# Patient Record
Sex: Female | Born: 1950
Health system: Southern US, Community
[De-identification: ages and names within clinical notes are randomized; demographics above are authoritative.]

## PROBLEM LIST (undated history)

## (undated) DIAGNOSIS — E785 Hyperlipidemia, unspecified: Secondary | ICD-10-CM

## (undated) DIAGNOSIS — C549 Malignant neoplasm of corpus uteri, unspecified: Secondary | ICD-10-CM

## (undated) DIAGNOSIS — T7840XA Allergy, unspecified, initial encounter: Secondary | ICD-10-CM

## (undated) DIAGNOSIS — G4730 Sleep apnea, unspecified: Secondary | ICD-10-CM

## (undated) DIAGNOSIS — J45909 Unspecified asthma, uncomplicated: Secondary | ICD-10-CM

## (undated) DIAGNOSIS — K219 Gastro-esophageal reflux disease without esophagitis: Secondary | ICD-10-CM

## (undated) HISTORY — DX: Gastro-esophageal reflux disease without esophagitis: K21.9

## (undated) HISTORY — DX: Unspecified asthma, uncomplicated: J45.909

## (undated) HISTORY — DX: Allergy, unspecified, initial encounter: T78.40XA

## (undated) HISTORY — DX: Malignant neoplasm of corpus uteri, unspecified: C54.9

## (undated) HISTORY — PX: COLONOSCOPY: SHX174

## (undated) HISTORY — DX: Hyperlipidemia, unspecified: E78.5

## (undated) HISTORY — DX: Sleep apnea, unspecified: G47.30

## (undated) HISTORY — PX: UPPER GASTROINTESTINAL ENDOSCOPY: SHX188

---

## 1998-01-10 DIAGNOSIS — C549 Malignant neoplasm of corpus uteri, unspecified: Secondary | ICD-10-CM

## 1998-01-10 HISTORY — PX: OOPHORECTOMY: SHX86

## 1998-01-10 HISTORY — PX: ABDOMINAL HYSTERECTOMY: SHX81

## 1998-01-10 HISTORY — DX: Malignant neoplasm of corpus uteri, unspecified: C54.9

## 1998-01-19 ENCOUNTER — Ambulatory Visit (HOSPITAL_COMMUNITY): Admission: RE | Admit: 1998-01-19 | Discharge: 1998-01-19 | Payer: Self-pay | Admitting: Gastroenterology

## 1998-02-25 ENCOUNTER — Ambulatory Visit (HOSPITAL_COMMUNITY): Admission: RE | Admit: 1998-02-25 | Discharge: 1998-02-25 | Payer: Self-pay | Admitting: Obstetrics and Gynecology

## 1998-09-10 ENCOUNTER — Other Ambulatory Visit: Admission: RE | Admit: 1998-09-10 | Discharge: 1998-09-10 | Payer: Self-pay | Admitting: Obstetrics and Gynecology

## 1998-09-10 ENCOUNTER — Encounter (INDEPENDENT_AMBULATORY_CARE_PROVIDER_SITE_OTHER): Payer: Self-pay | Admitting: Specialist

## 1998-10-07 ENCOUNTER — Encounter: Payer: Self-pay | Admitting: Obstetrics and Gynecology

## 1998-10-12 ENCOUNTER — Encounter (INDEPENDENT_AMBULATORY_CARE_PROVIDER_SITE_OTHER): Payer: Self-pay

## 1998-10-12 ENCOUNTER — Inpatient Hospital Stay (HOSPITAL_COMMUNITY): Admission: RE | Admit: 1998-10-12 | Discharge: 1998-10-14 | Payer: Self-pay | Admitting: Obstetrics and Gynecology

## 1999-02-22 ENCOUNTER — Other Ambulatory Visit: Admission: RE | Admit: 1999-02-22 | Discharge: 1999-02-22 | Payer: Self-pay | Admitting: Obstetrics and Gynecology

## 1999-08-31 ENCOUNTER — Other Ambulatory Visit: Admission: RE | Admit: 1999-08-31 | Discharge: 1999-08-31 | Payer: Self-pay | Admitting: Obstetrics and Gynecology

## 2000-02-22 ENCOUNTER — Other Ambulatory Visit: Admission: RE | Admit: 2000-02-22 | Discharge: 2000-02-22 | Payer: Self-pay | Admitting: Obstetrics and Gynecology

## 2001-02-22 ENCOUNTER — Other Ambulatory Visit: Admission: RE | Admit: 2001-02-22 | Discharge: 2001-02-22 | Payer: Self-pay | Admitting: Obstetrics and Gynecology

## 2002-01-10 LAB — HM COLONOSCOPY: HM Colonoscopy: NORMAL

## 2002-02-26 ENCOUNTER — Other Ambulatory Visit: Admission: RE | Admit: 2002-02-26 | Discharge: 2002-02-26 | Payer: Self-pay | Admitting: Obstetrics and Gynecology

## 2002-09-23 ENCOUNTER — Ambulatory Visit (HOSPITAL_COMMUNITY): Admission: RE | Admit: 2002-09-23 | Discharge: 2002-09-23 | Payer: Self-pay | Admitting: Gastroenterology

## 2003-03-03 ENCOUNTER — Other Ambulatory Visit: Admission: RE | Admit: 2003-03-03 | Discharge: 2003-03-03 | Payer: Self-pay | Admitting: Obstetrics and Gynecology

## 2003-11-25 ENCOUNTER — Ambulatory Visit: Payer: Self-pay | Admitting: Internal Medicine

## 2004-02-02 ENCOUNTER — Ambulatory Visit: Payer: Self-pay | Admitting: Internal Medicine

## 2004-02-11 ENCOUNTER — Ambulatory Visit: Payer: Self-pay | Admitting: Internal Medicine

## 2004-03-02 ENCOUNTER — Ambulatory Visit: Payer: Self-pay | Admitting: Internal Medicine

## 2004-07-07 ENCOUNTER — Ambulatory Visit: Payer: Self-pay | Admitting: Internal Medicine

## 2004-10-19 ENCOUNTER — Ambulatory Visit (HOSPITAL_BASED_OUTPATIENT_CLINIC_OR_DEPARTMENT_OTHER): Admission: RE | Admit: 2004-10-19 | Discharge: 2004-10-19 | Payer: Self-pay | Admitting: Internal Medicine

## 2004-10-28 ENCOUNTER — Ambulatory Visit: Payer: Self-pay | Admitting: Pulmonary Disease

## 2005-05-16 ENCOUNTER — Emergency Department (HOSPITAL_COMMUNITY): Admission: EM | Admit: 2005-05-16 | Discharge: 2005-05-16 | Payer: Self-pay | Admitting: Emergency Medicine

## 2005-05-17 ENCOUNTER — Ambulatory Visit: Payer: Self-pay | Admitting: Endocrinology

## 2005-05-31 ENCOUNTER — Ambulatory Visit: Payer: Self-pay | Admitting: Internal Medicine

## 2005-06-30 ENCOUNTER — Ambulatory Visit: Payer: Self-pay | Admitting: Internal Medicine

## 2005-07-18 ENCOUNTER — Ambulatory Visit: Payer: Self-pay | Admitting: Internal Medicine

## 2006-09-28 ENCOUNTER — Encounter: Payer: Self-pay | Admitting: *Deleted

## 2006-09-28 DIAGNOSIS — E785 Hyperlipidemia, unspecified: Secondary | ICD-10-CM | POA: Insufficient documentation

## 2006-12-06 ENCOUNTER — Encounter: Payer: Self-pay | Admitting: Internal Medicine

## 2006-12-15 ENCOUNTER — Encounter: Payer: Self-pay | Admitting: Internal Medicine

## 2007-02-23 ENCOUNTER — Ambulatory Visit: Payer: Self-pay | Admitting: Internal Medicine

## 2007-08-02 ENCOUNTER — Emergency Department (HOSPITAL_COMMUNITY): Admission: EM | Admit: 2007-08-02 | Discharge: 2007-08-02 | Payer: Self-pay | Admitting: Emergency Medicine

## 2008-03-18 ENCOUNTER — Telehealth: Payer: Self-pay | Admitting: Internal Medicine

## 2008-04-17 ENCOUNTER — Ambulatory Visit: Payer: Self-pay | Admitting: Internal Medicine

## 2008-04-17 LAB — CONVERTED CEMR LAB
ALT: 11 units/L (ref 0–35)
AST: 13 units/L (ref 0–37)
Albumin: 3.5 g/dL (ref 3.5–5.2)
Alkaline Phosphatase: 64 units/L (ref 39–117)
Bilirubin, Direct: 0.1 mg/dL (ref 0.0–0.3)
Cholesterol: 162 mg/dL (ref 0–200)
HDL: 48.4 mg/dL (ref >39.00)
LDL Cholesterol: 86 mg/dL (ref 0–99)
Total Bilirubin: 0.6 mg/dL (ref 0.3–1.2)
Total CHOL/HDL Ratio: 3
Total Protein: 6.4 g/dL (ref 6.0–8.3)
Triglycerides: 138 mg/dL (ref 0.0–149.0)
VLDL: 27.6 mg/dL (ref 0.0–40.0)

## 2008-04-21 ENCOUNTER — Ambulatory Visit: Payer: Self-pay | Admitting: Internal Medicine

## 2008-04-21 DIAGNOSIS — C549 Malignant neoplasm of corpus uteri, unspecified: Secondary | ICD-10-CM | POA: Insufficient documentation

## 2008-04-21 DIAGNOSIS — J309 Allergic rhinitis, unspecified: Secondary | ICD-10-CM | POA: Insufficient documentation

## 2008-06-09 ENCOUNTER — Encounter: Payer: Self-pay | Admitting: Internal Medicine

## 2010-01-05 ENCOUNTER — Encounter: Payer: Self-pay | Admitting: Internal Medicine

## 2010-04-13 ENCOUNTER — Other Ambulatory Visit: Payer: Self-pay | Admitting: Internal Medicine

## 2010-05-28 NOTE — Op Note (Signed)
   NAMELOUVINA, CLEARY                       ACCOUNT NO.:  [id number]   MEDICAL RECORD NO.:  [id number]                   PATIENT TYPE:  AMB   LOCATION:  ENDO                                 FACILITY:  [facility]   PHYSICIAN:  John C. Madilyn Fireman, M.D.                 DATE OF BIRTH:  03/12/1950   DATE OF PROCEDURE:  09/23/2002  DATE OF DISCHARGE:                                 OPERATIVE REPORT   PROCEDURE PERFORMED:  Colonoscopy.   ENDOSCOPIST:  Barrie Folk, M.D.   INDICATIONS FOR PROCEDURE:  Family history of colon cancer in two first  degree relatives.   DESCRIPTION OF PROCEDURE:  The patient was placed in the left lateral  decubitus position and placed on the pulse monitor with continuous low-flow  oxygen delivered by nasal cannula.  The patient was sedated with 75 mcg of  IV fentanyl and 7 mg of IV Versed.  The Olympus video colonoscope was  inserted into the rectum and advanced to the cecum, confirmed by  transillumination of McBurney's point and visualization of the ileocecal  valve and appendiceal orifice.  The prep was excellent.  The cecum,  ascending, transverse, descending and sigmoid colon appeared normal with no  masses, polyps, diverticula or other mucosal abnormalities.  The rectum  likewise appeared normal and retroflex view of the anus revealed no obvious  internal hemorrhoids.  The scope was then withdrawn and the patient returned  to the recovery room in stable condition.  She tolerated the procedure well.  There were no immediate complications.   IMPRESSION:  Normal colonoscopy.   PLAN:  Repeat study in five years.                                               John C. Madilyn Fireman, M.D.    [facility]/MEDQ  D:  09/23/2002  T:  09/23/2002  Job:  [id number]

## 2010-05-28 NOTE — Procedures (Signed)
Diana Hampton, Diana Hampton NO.:  [id number]   MEDICAL RECORD NO.:  [id number]          PATIENT TYPE:  OUT   LOCATION:  SLEEP CENTER                 FACILITY:  [facility]   PHYSICIAN:  Marcelyn Bruins, M.D. [facility] DATE OF BIRTH:  08/04/1950   DATE OF STUDY:  10/19/2004                              NOCTURNAL POLYSOMNOGRAM   REFERRING PHYSICIAN:  Dr. Illene Regulus.   DATE OF STUDY:  October 19, 2004   INDICATION FOR STUDY:  Hypersomnia with sleep apnea.   EPWORTH SLEEPINESS SCORE:  3   SLEEP ARCHITECTURE:  The patient had a total sleep time of 396 minutes with  adequate slow wave sleep, but decreased REM.  Sleep onset latency was normal  as was REM onset.  Sleep efficiency was 89%.   RESPIRATORY DATA:  The patient was found to have apneas and hypopneas for a  respiratory disturbance index of 21 events per hour.  The events were not  positional and there was moderate snoring noted.   OXYGEN DATA:  The patient had O2 desaturation as low as 87% associated with  her obstructive events.   CARDIAC DATA:  No clinically significant cardiac arrhythmias.   MOVEMENT/PARASOMNIA:  None.   IMPRESSION:  Moderate obstructive sleep apnea with a respiratory disturbance  index of 21 events per hour and oxygen desaturation as low as 87%.  Treatment for this degree of sleep apnea may include weight loss alone as  appropriate, upper airway surgery, oral appliance, or continuous positive  airway pressure.  Clinical correlation is suggested.           ______________________________  Marcelyn Bruins, M.D. [facility]  Diplomate, American Board of Sleep  Medicine     [facility]/MEDQ  D:  10/27/2004 08:54:50  T:  10/27/2004 10:01:34  Job:  [id number]

## 2010-06-06 ENCOUNTER — Other Ambulatory Visit: Payer: Self-pay | Admitting: Internal Medicine

## 2010-08-03 ENCOUNTER — Other Ambulatory Visit: Payer: Self-pay | Admitting: Internal Medicine

## 2010-08-03 NOTE — Telephone Encounter (Signed)
#  30 day supply given-patient has been recvg refills since last OV 04/21/2008. Pharmacy note: Patient Must Have Office Visit Prior To Any Future Refill Authorizations.

## 2010-09-23 ENCOUNTER — Other Ambulatory Visit: Payer: Self-pay | Admitting: Internal Medicine

## 2010-09-28 ENCOUNTER — Other Ambulatory Visit: Payer: Self-pay | Admitting: *Deleted

## 2010-09-28 MED ORDER — ATORVASTATIN CALCIUM 20 MG PO TABS: 20.0000 mg | ORAL_TABLET | Freq: Every day | ORAL | Status: DC

## 2010-10-21 ENCOUNTER — Other Ambulatory Visit: Payer: Self-pay | Admitting: Podiatry

## 2010-10-21 DIAGNOSIS — M79672 Pain in left foot: Secondary | ICD-10-CM

## 2010-10-22 ENCOUNTER — Other Ambulatory Visit: Payer: Self-pay

## 2010-10-25 ENCOUNTER — Ambulatory Visit
Admission: RE | Admit: 2010-10-25 | Discharge: 2010-10-25 | Disposition: A | Discharge status: Home / Self Care | Payer: Self-pay | Source: Ambulatory Visit | Attending: Podiatry | Admitting: Podiatry

## 2010-10-25 DIAGNOSIS — M79672 Pain in left foot: Secondary | ICD-10-CM

## 2010-10-25 MED ORDER — GADOBENATE DIMEGLUMINE 529 MG/ML IV SOLN
14.0000 mL | Freq: Once | INTRAVENOUS | Status: AC | PRN
  Administered 2010-10-25: 14 mL via INTRAVENOUS

## 2010-11-18 ENCOUNTER — Encounter: Payer: Self-pay | Admitting: Internal Medicine

## 2010-11-18 ENCOUNTER — Ambulatory Visit (INDEPENDENT_AMBULATORY_CARE_PROVIDER_SITE_OTHER): Payer: BC Managed Care – PPO | Admitting: Internal Medicine

## 2010-11-18 ENCOUNTER — Other Ambulatory Visit (INDEPENDENT_AMBULATORY_CARE_PROVIDER_SITE_OTHER): Payer: BC Managed Care – PPO

## 2010-11-18 VITALS — BP 120/68 | HR 79 | Temp 96.3°F | Ht 65.0 in | Wt 151.0 lb

## 2010-11-18 DIAGNOSIS — Z0000 Encounter for general adult medical examination without abnormal findings: Secondary | ICD-10-CM

## 2010-11-18 DIAGNOSIS — G4730 Sleep apnea, unspecified: Secondary | ICD-10-CM

## 2010-11-18 DIAGNOSIS — E785 Hyperlipidemia, unspecified: Secondary | ICD-10-CM

## 2010-11-18 DIAGNOSIS — J309 Allergic rhinitis, unspecified: Secondary | ICD-10-CM

## 2010-11-18 LAB — LIPID PANEL
Cholesterol: 177 mg/dL (ref 0–200)
HDL: 62.8 mg/dL (ref >39.00)
LDL Cholesterol: 87 mg/dL (ref 0–99)
Total CHOL/HDL Ratio: 3
Triglycerides: 135 mg/dL (ref 0.0–149.0)
VLDL: 27 mg/dL (ref 0.0–40.0)

## 2010-11-18 LAB — HEPATIC FUNCTION PANEL
ALT: 20 U/L (ref 0–35)
AST: 15 U/L (ref 0–37)
Albumin: 3.9 g/dL (ref 3.5–5.2)
Alkaline Phosphatase: 56 U/L (ref 39–117)
Bilirubin, Direct: 0 mg/dL (ref 0.0–0.3)
Total Bilirubin: 0.5 mg/dL (ref 0.3–1.2)
Total Protein: 7.1 g/dL (ref 6.0–8.3)

## 2010-11-18 LAB — COMPREHENSIVE METABOLIC PANEL
ALT: 20 U/L (ref 0–35)
AST: 15 U/L (ref 0–37)
Albumin: 3.9 g/dL (ref 3.5–5.2)
Alkaline Phosphatase: 56 U/L (ref 39–117)
BUN: 15 mg/dL (ref 6–23)
CO2: 29 mEq/L (ref 19–32)
Calcium: 8.9 mg/dL (ref 8.4–10.5)
Chloride: 104 mEq/L (ref 96–112)
Creatinine, Ser: 0.9 mg/dL (ref 0.4–1.2)
GFR: 70.51 mL/min (ref >60.00)
Glucose, Bld: 90 mg/dL (ref 70–99)
Potassium: 4.1 mEq/L (ref 3.5–5.1)
Sodium: 141 mEq/L (ref 135–145)
Total Bilirubin: 0.5 mg/dL (ref 0.3–1.2)
Total Protein: 7.1 g/dL (ref 6.0–8.3)

## 2010-11-18 MED ORDER — ATORVASTATIN CALCIUM 20 MG PO TABS: 20.0000 mg | ORAL_TABLET | Freq: Every day | ORAL | Status: DC

## 2010-11-18 NOTE — Progress Notes (Signed)
Subjective:    Patient ID: Diana Hampton, female    DOB: 02-Jan-1951, 60 y.o.   MRN: [id number]  HPI Diana Hampton presents for medication refill. She has had her gyn exam 2012 nl  Pelvic/PAP and breast exam (Diana Hampton); she has seen Dr. Beaulah Hampton for allergy who has done a full respiratory exam with spirometry. She has been feeling well and doing well with no illness, no surgery or injury.   She did have a sleep study in '06 with an AHI of 21 with the lowest O2 sat of 87% She reports that her symptoms are worse, more breath holding and increased somnolence.   Past Medical History  Diagnosis Date  . Malignant neoplasm of corpus uteri, except isthmus   . Allergy   . Hyperlipidemia    Past Surgical History  Procedure Date  . Abdominal hysterectomy 2000  . Oophorectomy 2000   Family History  Problem Relation Age of Onset  . Hypertension Mother   . Cancer Father 47    Colon  . Heart disease Brother 16    MI  . Diabetes Other     Grandfather   History   Social History  . Marital Status: Married    Spouse Name: N/A    Number of Children: 2  . Years of Education: 14   Occupational History  . 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 Smoking status: Never Smoker   . Smokeless tobacco: Never Used  . Alcohol Use: No  . Drug Use: No  . Sexually Active: Yes -- Female partner(s)   Other Topics Concern  . 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 1 Son 61, 1 daughter 1977-deceased '78 OD (she has had counseling); 5 grandchildren (3 biological, 2 step).  16 y/o granddaughter lives with her, 35 y/o every weekend. Marriage in good health.        Review of Systems Constitutional:  Negative for fever, chills, activity change and unexpected weight change.  HEENT:  Negative for hearing loss, ear pain, congestion, neck stiffness and postnasal  drip. Negative for sore throat or swallowing problems. Negative for dental complaints.   Eyes: Negative for vision loss or change in visual acuity.  Respiratory: Negative for chest tightness and wheezing. Negative for DOE.   Cardiovascular: Negative for chest pain or palpitations. No decreased exercise tolerance Gastrointestinal: No change in bowel habit. No bloating or gas. No reflux or indigestion Genitourinary: Negative for urgency, frequency, flank pain and difficulty urinating.  Musculoskeletal: Negative for myalgias, back pain, arthralgias and gait problem.  Neurological: Negative for dizziness, tremors, weakness and headaches.  Hematological: Negative for adenopathy.  Psychiatric/Behavioral: Negative for behavioral problems and dysphoric mood.       Objective:   Physical Exam Vitals reviewed: great Blood pressure Gen'l- WNWD white woman in no distress HEENT_EACs/TMs normal, oropharynx w/o lesions, C&S clear, PERRLA Neck- supple, no thyromegaly Chest - w/o deformity Lungs - clear to auscultation and percussion Cor - 2+ radial pulse, DP pulse, RRR Neuro - non-focal. Nl gait  Lab Results  Component Value Date   GLUCOSE 90 11/18/2010   CHOL 177 11/18/2010   TRIG 135.0 11/18/2010   HDL 62.80 11/18/2010   LDLCALC 87 11/18/2010   ALT 20 11/18/2010   ALT 20 11/18/2010   AST 15 11/18/2010   AST 15 11/18/2010   NA  141 11/18/2010   K 4.1 11/18/2010   CL 104 11/18/2010   CREATININE 0.9 11/18/2010   BUN 15 11/18/2010   CO2 29 11/18/2010          Assessment & Plan:

## 2010-11-21 DIAGNOSIS — Z0000 Encounter for general adult medical examination without abnormal findings: Secondary | ICD-10-CM | POA: Insufficient documentation

## 2010-11-21 NOTE — Assessment & Plan Note (Signed)
Lab reveals LDL to be better than goal of 130 or less. Liver functions normal.  Plan- continue present regimen

## 2010-11-21 NOTE — Assessment & Plan Note (Signed)
Interval medical history is unremarkable. Physical exam, sans pelvic and breast, is normal. Lab results are normal. She is current with gyn and breast health. She is current with colorectal cancer screening with last study in '04. Immunizations: none on record and she is due, then, for Tdap, pneumonia and shingles vaccine.  In summary- a nice woman who is medically stable. She will return as needed.

## 2010-11-21 NOTE — Assessment & Plan Note (Signed)
Current with Dr. Beaulah Dinning

## 2010-11-22 ENCOUNTER — Encounter: Payer: Self-pay | Admitting: Internal Medicine

## 2010-12-15 ENCOUNTER — Encounter: Payer: Self-pay | Admitting: Pulmonary Disease

## 2010-12-15 ENCOUNTER — Ambulatory Visit (INDEPENDENT_AMBULATORY_CARE_PROVIDER_SITE_OTHER): Payer: BC Managed Care – PPO | Admitting: Pulmonary Disease

## 2010-12-15 VITALS — BP 112/62 | HR 68 | Temp 97.9°F | Ht 66.0 in | Wt 151.6 lb

## 2010-12-15 DIAGNOSIS — G4733 Obstructive sleep apnea (adult) (pediatric): Secondary | ICD-10-CM

## 2010-12-15 NOTE — Progress Notes (Signed)
  Subjective:    Patient ID: Diana Hampton, female    DOB: [birth year], 60 y.o.   MRN: [id number]  HPI The patient is a 60 year old female who I've been asked to see for management of obstructive sleep apnea.  She was diagnosed with moderate sleep apnea in 2006, where she had an AHI of 21 events per hour.  She did work on weight loss, but continued to have a significant snoring issue.  Over the years her snoring has worsened, and her husband has noticed a gurgling sound and an abnormal breathing pattern during sleep.  The patient has also had snoring as well as choking arousals.  She has frequent awakenings at night, and is not rested in the mornings upon arising.  She does not have major issues while at work, but does note some sleep pressure and tiredness with inactivity.  In the evenings, she is unable to stay awake if she sits down.  She denies any sleepiness issues with driving.  Of note, her weight is up about 16 pounds over the last 2 years.   Review of Systems  Constitutional: Negative for fever and unexpected weight change.  HENT: Positive for congestion, sore throat, rhinorrhea, postnasal drip and sinus pressure. Negative for ear pain, nosebleeds, sneezing, trouble swallowing and dental problem.   Eyes: Negative for redness and itching.  Respiratory: Positive for wheezing. Negative for cough, chest tightness and shortness of breath.   Cardiovascular: Negative for palpitations and leg swelling.  Gastrointestinal: Negative for nausea and vomiting.  Genitourinary: Negative for dysuria.  Musculoskeletal: Negative for joint swelling.  Skin: Negative for rash.  Neurological: Positive for headaches.  Hematological: Does not bruise/bleed easily.  Psychiatric/Behavioral: Negative for dysphoric mood. The patient is not nervous/anxious.        Objective:   Physical Exam Constitutional:  Well developed, no acute distress  HENT:  Nares patent without discharge, but deviated septum to left  with significant narrowing.  Oropharynx without exudate, palate and uvula are elongated.   Eyes:  Perrla, eomi, no scleral icterus  Neck:  No JVD, no TMG  Cardiovascular:  Normal rate, regular rhythm, no rubs or gallops.  No murmurs        Intact distal pulses  Pulmonary :  Normal breath sounds, no stridor or respiratory distress   No rales, rhonchi, or wheezing  Abdominal:  Soft, nondistended, bowel sounds present.  No tenderness noted.   Musculoskeletal:  No lower extremity edema noted.  Lymph Nodes:  No cervical lymphadenopathy noted  Skin:  No cyanosis noted  Neurologic:  Alert, appropriate, moves all 4 extremities without obvious deficit.         Assessment & Plan:

## 2010-12-15 NOTE — Assessment & Plan Note (Signed)
The patient has a history of moderate sleep apnea, but has had worsening symptoms since that time.  She is clearly having sleep disruption at night, and has abnormal daytime sleepiness.  She certainly could work on modest weight loss, but I suspect she is going to need more aggressive treatment in order to adequately treat her sleep disordered breathing.  She does have a small lower jaw with an overbite, and this perhaps is her primary issue.  I discussed with her the various treatments, including surgery, dental appliance, and CPAP.  I really do not think surgery is in her best interest, and she feels that she would not be able to wear a dental appliance because of gagging.  She is willing to give CPAP a try. I will set the patient up on cpap at a moderate pressure level to allow for desensitization, and will troubleshoot the device over the next 4-6weeks if needed.  The pt is to call me if having issues with tolerance.  Will then optimize the pressure once patient is able to wear cpap on a consistent basis.

## 2010-12-15 NOTE — Patient Instructions (Signed)
Will start on cpap.  Please call us if having issues with tolerance Work on modest weight loss followup with me in 5 weeks.

## 2010-12-27 ENCOUNTER — Encounter: Payer: Self-pay | Admitting: Pulmonary Disease

## 2010-12-29 ENCOUNTER — Telehealth: Payer: Self-pay | Admitting: Pulmonary Disease

## 2010-12-29 DIAGNOSIS — G4733 Obstructive sleep apnea (adult) (pediatric): Secondary | ICD-10-CM

## 2010-12-29 NOTE — Telephone Encounter (Signed)
Spoke with patient-agrees to set CPAP to auto. Order placed to [facility] for [facility]

## 2010-12-29 NOTE — Telephone Encounter (Signed)
Spoke with pt. She states that she called [facility] at 4:30 to check on CPAP pressure change and was told by [facility] that we never sent the order to them. Chart indicates that the order was given to [place] today at 2:30. She wants this taken care of asap. Will forward to [facility] to re send the order or troubleshoot what the issue is. Please advise, thanks!

## 2010-12-29 NOTE — Telephone Encounter (Signed)
Let pt know she is on a fairly low pressure currently.  Rather than decrease, why don't we change over to auto setting where the machine self adjusts itself to how much she needs.  This is usually a more comfortable setting for most people.  If she is ok with this, will send order to [facility].

## 2010-12-29 NOTE — Telephone Encounter (Signed)
Per pt, she is having a lot of anxiety about using cpap. She says she would like to have the pressure dropped back down until she can get used to it. She did not sleep well last night due to her anxiety. She assured me that it is not a problem with wearing the mask, it is the air pressure. Pls advise.

## 2010-12-30 ENCOUNTER — Telehealth: Payer: Self-pay | Admitting: Pulmonary Disease

## 2010-12-30 NOTE — Telephone Encounter (Signed)
refaxed orders to [facility] pt notified

## 2010-12-30 NOTE — Telephone Encounter (Signed)
Spoke with Inetta Fermo at [facility] and she states that she did not see order for auto cpap change, so I gave her VO and she states will take care of this and call the pt this afternoon. Pt aware of this and states nothing further needed.

## 2011-01-03 ENCOUNTER — Telehealth: Payer: Self-pay

## 2011-01-03 MED ORDER — ATORVASTATIN CALCIUM 20 MG PO TABS: 20.0000 mg | ORAL_TABLET | Freq: Every day | ORAL | Status: DC

## 2011-01-03 NOTE — Telephone Encounter (Signed)
Medication refill for 90days

## 2011-01-19 ENCOUNTER — Encounter: Payer: Self-pay | Admitting: Internal Medicine

## 2011-01-19 ENCOUNTER — Ambulatory Visit (INDEPENDENT_AMBULATORY_CARE_PROVIDER_SITE_OTHER): Payer: BC Managed Care – PPO | Admitting: Pulmonary Disease

## 2011-01-19 ENCOUNTER — Encounter: Payer: Self-pay | Admitting: Pulmonary Disease

## 2011-01-19 VITALS — BP 112/80 | HR 66 | Temp 98.2°F | Ht 66.0 in | Wt 154.0 lb

## 2011-01-19 DIAGNOSIS — G4733 Obstructive sleep apnea (adult) (pediatric): Secondary | ICD-10-CM

## 2011-01-19 NOTE — Progress Notes (Signed)
  Subjective:    Patient ID: Diana Hampton, female    DOB: [birth year], 61 y.o.   MRN: [id number]  HPI Patient comes in today for followup of her structured sleep apnea.  At the last visit, she was started on CPAP, but had issues with pressure tolerance.  She was changed over to the automatic setting, and has done very well since that time.  Her sleep is much improved, and she has good daytime alertness at this point.  She is having no issues with her mask or pressure currently.   Review of Systems  Constitutional: Negative for fever and unexpected weight change.  HENT: Negative for ear pain, nosebleeds, congestion, sore throat, rhinorrhea, sneezing, trouble swallowing, dental problem, postnasal drip and sinus pressure.   Eyes: Negative for redness and itching.  Respiratory: Negative for cough, chest tightness, shortness of breath and wheezing.   Cardiovascular: Negative for palpitations and leg swelling.  Gastrointestinal: Negative for nausea and vomiting.  Genitourinary: Negative for dysuria.  Musculoskeletal: Negative for joint swelling.  Skin: Negative for rash.  Neurological: Negative for headaches.  Hematological: Does not bruise/bleed easily.  Psychiatric/Behavioral: Negative for dysphoric mood. The patient is not nervous/anxious.        Objective:   Physical Exam Well-developed female in no acute distress No skin breakdown or pressure necrosis from the CPAP mask Lower extremities without edema, no cyanosis noted Alert, oriented, does not appear to be sleepy, lives all 4 extremities.       Assessment & Plan:

## 2011-01-19 NOTE — Patient Instructions (Signed)
Continue with cpap, and call if having tolerance issues.  Work on weight loss followup with me in 6mos.

## 2011-01-19 NOTE — Assessment & Plan Note (Signed)
The patient is doing very well with CPAP on the automatic setting.  She feels that she is sleeping well, and has improved daytime alertness.  I have asked her to continue with the CPAP, and to work on modest weight loss.  I've also asked her to keep up with the mask changes and supplies.  She will followup with me in 6 months or sooner if having issues.

## 2011-05-01 ENCOUNTER — Other Ambulatory Visit: Payer: Self-pay | Admitting: Pulmonary Disease

## 2011-06-25 ENCOUNTER — Other Ambulatory Visit: Payer: Self-pay | Admitting: Pulmonary Disease

## 2011-06-25 DIAGNOSIS — G4733 Obstructive sleep apnea (adult) (pediatric): Secondary | ICD-10-CM

## 2011-07-01 ENCOUNTER — Telehealth: Payer: Self-pay | Admitting: Pulmonary Disease

## 2011-07-01 NOTE — Telephone Encounter (Signed)
lmomtcb for lecretia 

## 2011-07-04 NOTE — Telephone Encounter (Signed)
Called, spoke with Diana Hampton.  Pt was given a new cpap at the end of 2012.  An order was placed on 06/25/11, for:  [facility] Needs new s9 escape/auto with h/h set on auto setting (same as current machine).  Diana Hampton would like to clarify this bc insurance will not cover a new machine this soon.  ? Is something wrong with pt's current machine?  I do not see any recent phone msg regarding this and last OV was 01/19/11 w/ no mention of needing a new machine.  Dr. Shelle Iron, pls advise.  Thank you.

## 2011-07-04 NOTE — Telephone Encounter (Signed)
I have reviewed chart, and I can see no reason that I would have done this unless: 1) I received something in the mail on a form requesting this, or pt communicated with me somehow that her machine was not working. 2) I put order in on wrong pt.  If I remember correctly, I received something from dme saying her machine was due for replacement and asking for new machine. I she just got new machine, obviously doesn't need a new one.

## 2011-07-04 NOTE — Telephone Encounter (Signed)
Called and spoke with patient on mobile number. She stated that her cpap machine is working properly and she is not having any issues with her cpap.

## 2011-07-19 ENCOUNTER — Ambulatory Visit (INDEPENDENT_AMBULATORY_CARE_PROVIDER_SITE_OTHER): Payer: BC Managed Care – PPO | Admitting: Pulmonary Disease

## 2011-07-19 ENCOUNTER — Encounter: Payer: Self-pay | Admitting: Pulmonary Disease

## 2011-07-19 VITALS — BP 112/72 | HR 62 | Temp 97.9°F | Ht 66.0 in | Wt 149.4 lb

## 2011-07-19 DIAGNOSIS — G4733 Obstructive sleep apnea (adult) (pediatric): Secondary | ICD-10-CM

## 2011-07-19 NOTE — Assessment & Plan Note (Addendum)
The patient is doing very well on CPAP, and is having no issues with her mask or pressure.  She feels that she is sleeping well, and has excellent daytime alertness.  She is having success with weight loss, and will continue to do so.

## 2011-07-19 NOTE — Patient Instructions (Addendum)
Continue with cpap, work on weight loss.  You are doing well. followup with me in one year, but call if questions or problems.

## 2011-07-19 NOTE — Progress Notes (Signed)
  Subjective:    Patient ID: Diana Hampton, female    DOB: 1950-11-09, 61 y.o.   MRN: [id number]  HPI The patient comes in today for followup of her known obstructive sleep apnea.  She is wearing CPAP compliantly, and is having no issues with a CPAP pressure or mask fit.  She is on the automatic setting, and is doing well with this.  She has lost weight since the last visit, and is continuing to do so.  She feels that she sleeps well, and denies any alertness issues during the day.   Review of Systems  Constitutional: Negative for fever and unexpected weight change.  HENT: Negative for ear pain, nosebleeds, congestion, sore throat, rhinorrhea, sneezing, trouble swallowing, dental problem, postnasal drip and sinus pressure.   Eyes: Negative for redness and itching.  Respiratory: Negative for cough, chest tightness, shortness of breath and wheezing.   Cardiovascular: Negative for palpitations and leg swelling.  Gastrointestinal: Negative for nausea and vomiting.  Genitourinary: Negative for dysuria.  Musculoskeletal: Negative for joint swelling.  Skin: Negative for rash.  Neurological: Negative for headaches.  Hematological: Does not bruise/bleed easily.  Psychiatric/Behavioral: Negative for dysphoric mood. The patient is not nervous/anxious.   All other systems reviewed and are negative.       Objective:   Physical Exam Well-developed female in no acute distress Nose with purulent discharge noted No skin breakdown or pressure necrosis from the CPAP mask Lower extremities without edema, no cyanosis Lower and oriented, moves all 4 extremities.  Does not appear to be sleepy.       Assessment & Plan:

## 2011-07-20 ENCOUNTER — Ambulatory Visit: Payer: BC Managed Care – PPO | Admitting: Pulmonary Disease

## 2012-02-17 ENCOUNTER — Other Ambulatory Visit: Payer: Self-pay | Admitting: *Deleted

## 2012-02-17 MED ORDER — ATORVASTATIN CALCIUM 20 MG PO TABS: 20.0000 mg | ORAL_TABLET | Freq: Every day | ORAL | Status: DC

## 2012-07-18 ENCOUNTER — Ambulatory Visit (INDEPENDENT_AMBULATORY_CARE_PROVIDER_SITE_OTHER): Payer: BC Managed Care – PPO | Admitting: Pulmonary Disease

## 2012-07-18 ENCOUNTER — Encounter: Payer: Self-pay | Admitting: Pulmonary Disease

## 2012-07-18 VITALS — BP 112/70 | HR 64 | Temp 97.7°F | Ht 66.5 in | Wt 151.6 lb

## 2012-07-18 DIAGNOSIS — G4733 Obstructive sleep apnea (adult) (pediatric): Secondary | ICD-10-CM

## 2012-07-18 NOTE — Progress Notes (Signed)
  Subjective:    Patient ID: Diana Hampton, female    DOB: 27-Dec-1950, 63 y.o.   MRN: [id number]  HPI Patient comes in today for followup of her obstructive sleep apnea.  She is wearing CPAP compliantly, is having no issues with her mask fit or pressure.  She feels that she sleeps well, and denies any issues with daytime alertness.  She is trying to work on weight reduction.   Review of Systems  Constitutional: Negative for fever and unexpected weight change.  HENT: Positive for congestion. Negative for ear pain, nosebleeds, sore throat, rhinorrhea, sneezing, trouble swallowing, dental problem, postnasal drip and sinus pressure.   Eyes: Negative for redness and itching.  Respiratory: Negative for cough, chest tightness, shortness of breath and wheezing.   Cardiovascular: Negative for palpitations and leg swelling.  Gastrointestinal: Negative for nausea and vomiting.  Genitourinary: Negative for dysuria.  Musculoskeletal: Negative for joint swelling.  Skin: Negative for rash.  Neurological: Negative for headaches.  Hematological: Does not bruise/bleed easily.  Psychiatric/Behavioral: Negative for dysphoric mood. The patient is not nervous/anxious.        Objective:   Physical Exam Well-developed female in no acute distress Nose with purulent discharge noted No skin breakdown or pressure necrosis from the CPAP mask Neck without lymphadenopathy or thyromegaly Lower extremities without edema, no cyanosis Alert, does not appear to be sleepy, moves all 4 extremities.       Assessment & Plan:

## 2012-07-18 NOTE — Patient Instructions (Addendum)
Continue with cpap, and keep up with mask changes and supplies. Continue working on weight reduction. followup with me in one year if doing well.

## 2012-07-18 NOTE — Assessment & Plan Note (Signed)
The patient continues to do well on CPAP, and I have asked her to keep up with her mask changes and supplies.  I have also encouraged her to work on modest weight loss.  She is to followup with me in one year if she continues to do well.

## 2012-07-20 ENCOUNTER — Telehealth: Payer: Self-pay | Admitting: Pulmonary Disease

## 2012-07-20 DIAGNOSIS — G4733 Obstructive sleep apnea (adult) (pediatric): Secondary | ICD-10-CM

## 2012-07-20 NOTE — Telephone Encounter (Signed)
lmtcb x1 for pt. 

## 2012-07-20 NOTE — Telephone Encounter (Signed)
I told her that she needed to go to her DME and let them try her on the airfit N10 before ordering it.  We don't know if it will fit her properly or not.  Everyone's face is shaped differently.

## 2012-07-20 NOTE — Telephone Encounter (Signed)
Called and spoke with pt and she stated that she would like to try a new mask.  She stated that [facility] showed her a new mask at her last ov and she thinks this was an N10??  Pt could not remember but wanted me to send this message to [facility] to make sure before we sent the order to [facility] for a new mask.  Pt stated that her current mask leaves marks on her face and the pieces are falling out at night.  KC please advise. Thanks  No Known Allergies

## 2012-07-23 NOTE — Telephone Encounter (Signed)
Pt aware. She stated she called [facility] and was advised they needed an order for this. I advised pt will place order for her. Nothing further was needed

## 2012-08-01 ENCOUNTER — Other Ambulatory Visit (INDEPENDENT_AMBULATORY_CARE_PROVIDER_SITE_OTHER): Payer: BC Managed Care – PPO

## 2012-08-01 ENCOUNTER — Ambulatory Visit (INDEPENDENT_AMBULATORY_CARE_PROVIDER_SITE_OTHER): Payer: BC Managed Care – PPO | Admitting: Internal Medicine

## 2012-08-01 ENCOUNTER — Encounter: Payer: Self-pay | Admitting: Internal Medicine

## 2012-08-01 VITALS — BP 136/78 | HR 59 | Temp 97.0°F | Ht 66.0 in | Wt 150.0 lb

## 2012-08-01 DIAGNOSIS — E785 Hyperlipidemia, unspecified: Secondary | ICD-10-CM

## 2012-08-01 DIAGNOSIS — H35019 Changes in retinal vascular appearance, unspecified eye: Secondary | ICD-10-CM

## 2012-08-01 DIAGNOSIS — Z1211 Encounter for screening for malignant neoplasm of colon: Secondary | ICD-10-CM

## 2012-08-01 DIAGNOSIS — H35013 Changes in retinal vascular appearance, bilateral: Secondary | ICD-10-CM

## 2012-08-01 LAB — HEMOGLOBIN A1C: Hgb A1c MFr Bld: 6.3 % (ref 4.6–6.5)

## 2012-08-01 LAB — TSH: TSH: 2.53 u[IU]/mL (ref 0.35–5.50)

## 2012-08-01 LAB — COMPREHENSIVE METABOLIC PANEL
ALT: 18 U/L (ref 0–35)
AST: 18 U/L (ref 0–37)
Albumin: 4 g/dL (ref 3.5–5.2)
Alkaline Phosphatase: 58 U/L (ref 39–117)
BUN: 14 mg/dL (ref 6–23)
CO2: 28 mEq/L (ref 19–32)
Calcium: 9.8 mg/dL (ref 8.4–10.5)
Chloride: 104 mEq/L (ref 96–112)
Creatinine, Ser: 0.8 mg/dL (ref 0.4–1.2)
GFR: 75.07 mL/min (ref >60.00)
Glucose, Bld: 91 mg/dL (ref 70–99)
Potassium: 4.7 mEq/L (ref 3.5–5.1)
Sodium: 139 mEq/L (ref 135–145)
Total Bilirubin: 0.6 mg/dL (ref 0.3–1.2)
Total Protein: 7 g/dL (ref 6.0–8.3)

## 2012-08-01 LAB — LIPID PANEL
Cholesterol: 209 mg/dL — ABNORMAL HIGH (ref 0–200)
HDL: 62.3 mg/dL (ref >39.00)
Total CHOL/HDL Ratio: 3
Triglycerides: 142 mg/dL (ref 0.0–149.0)
VLDL: 28.4 mg/dL (ref 0.0–40.0)

## 2012-08-01 LAB — HEPATIC FUNCTION PANEL
ALT: 18 U/L (ref 0–35)
AST: 18 U/L (ref 0–37)
Albumin: 4 g/dL (ref 3.5–5.2)
Alkaline Phosphatase: 58 U/L (ref 39–117)
Bilirubin, Direct: 0.1 mg/dL (ref 0.0–0.3)
Total Bilirubin: 0.6 mg/dL (ref 0.3–1.2)
Total Protein: 7 g/dL (ref 6.0–8.3)

## 2012-08-01 LAB — LDL CHOLESTEROL, DIRECT: Direct LDL: 124.5 mg/dL

## 2012-08-01 NOTE — Patient Instructions (Addendum)
1. Evaluation for possible diabetes due to retinal abnormalities seen at eye exam:  Chart reviewed - last lab in 2012 with a normal blood sugar Lab today: serum glucose and A1C- a long term look at blood sugar that is the gold standard for making the diagnosis of diabetes  2. Cholesterol management - overdue for follow up lab  Plan Lab today with result via MyChart  3. Health maintenance - due for a general exam at first available.

## 2012-08-01 NOTE — Progress Notes (Signed)
Subjective:    Patient ID: Diana Hampton, female    DOB: 04-09-1950, 62 y.o.   MRN: [id number]  HPI Diana Hampton presents for check of blood sugar. Her OD report indicated dot-blot hemorrhages retina and raised the issue of possible diabetes. Last lab in '12 with Glucose = 90. Has had increase thirst, increased urination, increased hunger.   She is folllowed for hyperlipidemia - last lab in '12.  She is due for gyn exam.   Past Medical History  Diagnosis Date  . Malignant neoplasm of corpus uteri, except isthmus   . Allergy   . Hyperlipidemia    Past Surgical History  Procedure Laterality Date  . Abdominal hysterectomy  2000  . Oophorectomy  2000   Family History  Problem Relation Age of Onset  . Hypertension Mother   . Cancer Father 22    Colon  . Heart disease Brother 24    MI  . Diabetes Other     Grandfather   History   Social History  . Marital Status: Married    Spouse Name: N/A    Number of Children: 2  . Years of Education: 14   Occupational History  . 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 Smoking status: Never Smoker   . Smokeless tobacco: Never Used  . Alcohol Use: No  . Drug Use: No  . Sexually Active: Yes -- Female partner(s)   Other Topics Concern  . 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 1 Son 59, 1 daughter 1977-deceased '36 OD (she has had counseling); 5 grandchildren (3 biological, 2 step).  60 y/o granddaughter lives with her, 34 y/o every weekend. Marriage in good health.      Current Outpatient Prescriptions on File Prior to Visit  Medication Sig Dispense Refill  . albuterol (PROVENTIL HFA;VENTOLIN HFA) 108 (90 BASE) MCG/ACT inhaler Inhale 2 puffs into the lungs every 6 (six) hours as needed for wheezing.      Marland Kitchen atorvastatin (LIPITOR) 20 MG tablet Take 1 tablet (20 mg total) by mouth daily.   90 tablet  0  . montelukast (SINGULAIR) 10 MG tablet Take 10 mg by mouth at bedtime.         No current facility-administered medications on file prior to visit.     Review of Systems System review is negative for any constitutional, cardiac, pulmonary, GI or neuro symptoms or complaints other than as described in the HPI.     Objective:   Physical Exam Filed Vitals:   08/01/12 0933  BP: 136/78  Pulse: 59  Temp: 97 F (36.1 C)   Gen'l WNWD white woman in no distress but anxious Cor- RRR Pulm - normal respirations Neuro - awake, alert, oriented. Normal gait  Recent Results (from the past 2160 hour(s))  HEMOGLOBIN A1C     Status: None   Collection Time    08/01/12 10:43 AM      Result Value Range   Hemoglobin A1C 6.3  4.6 - 6.5 %   Comment: Glycemic Control Guidelines for People with Diabetes:Non Diabetic:  <6%Goal of Therapy: <7%Additional Action Suggested:  >8%   COMPREHENSIVE METABOLIC PANEL     Status: None   Collection Time    08/01/12 10:43 AM      Result Value Range   Sodium 139  135 - 145 mEq/L  Potassium 4.7  3.5 - 5.1 mEq/L   Chloride 104  96 - 112 mEq/L   CO2 28  19 - 32 mEq/L   Glucose, Bld 91  70 - 99 mg/dL   BUN 14  6 - 23 mg/dL   Creatinine, Ser 0.8  0.4 - 1.2 mg/dL   Total Bilirubin 0.6  0.3 - 1.2 mg/dL   Alkaline Phosphatase 58  39 - 117 U/L   AST 18  0 - 37 U/L   ALT 18  0 - 35 U/L   Total Protein 7.0  6.0 - 8.3 g/dL   Albumin 4.0  3.5 - 5.2 g/dL   Calcium 9.8  8.4 - 54.0 mg/dL   GFR 98.11  >91.47 mL/min  LIPID PANEL     Status: Abnormal   Collection Time    08/01/12 10:43 AM      Result Value Range   Cholesterol 209 (*) 0 - 200 mg/dL   Comment: ATP III Classification       Desirable:  < 200 mg/dL               Borderline High:  200 - 239 mg/dL          High:  > = 829 mg/dL   Triglycerides 562.1  0.0 - 149.0 mg/dL   Comment: Normal:  <308 mg/dLBorderline High:  150 - 199 mg/dL   HDL 65.78  >46.96 mg/dL   VLDL 29.5  0.0 - 28.4 mg/dL    Total CHOL/HDL Ratio 3     Comment:                Men          Women1/2 Average Risk     3.4          3.3Average Risk          5.0          4.42X Average Risk          9.6          7.13X Average Risk          15.0          11.0                      HEPATIC FUNCTION PANEL     Status: None   Collection Time    08/01/12 10:43 AM      Result Value Range   Total Bilirubin 0.6  0.3 - 1.2 mg/dL   Bilirubin, Direct 0.1  0.0 - 0.3 mg/dL   Alkaline Phosphatase 58  39 - 117 U/L   AST 18  0 - 37 U/L   ALT 18  0 - 35 U/L   Total Protein 7.0  6.0 - 8.3 g/dL   Albumin 4.0  3.5 - 5.2 g/dL  TSH     Status: None   Collection Time    08/01/12 10:43 AM      Result Value Range   TSH 2.53  0.35 - 5.50 uIU/mL  LDL CHOLESTEROL, DIRECT     Status: None   Collection Time    08/01/12 10:43 AM      Result Value Range   Direct LDL 124.5     Comment: Optimal:  <100 mg/dLNear or Above Optimal:  100-129 mg/dLBorderline High:  130-159 mg/dLHigh:  160-189 mg/dLVery High:  >190 mg/dL          Assessment & Plan:  1. Abnormal eye exam -  no evidence of diabetes

## 2012-08-02 ENCOUNTER — Telehealth: Payer: Self-pay

## 2012-08-02 NOTE — Telephone Encounter (Signed)
Patient notified of results.

## 2012-08-02 NOTE — Telephone Encounter (Signed)
Message copied by Noreene Larsson on Thu Aug 02, 2012  8:21 AM ------      Message from: Illene Regulus E      Created: Wed Aug 01, 2012 10:58 PM       Blood sugar is normal! ------

## 2012-08-04 ENCOUNTER — Encounter: Payer: Self-pay | Admitting: Internal Medicine

## 2012-09-26 ENCOUNTER — Other Ambulatory Visit: Payer: Self-pay

## 2012-09-26 MED ORDER — ATORVASTATIN CALCIUM 20 MG PO TABS: 20.0000 mg | ORAL_TABLET | Freq: Every day | ORAL | Status: DC

## 2012-10-04 ENCOUNTER — Encounter: Payer: BC Managed Care – PPO | Admitting: Internal Medicine

## 2012-12-18 ENCOUNTER — Encounter: Payer: Self-pay | Admitting: Internal Medicine

## 2012-12-18 ENCOUNTER — Other Ambulatory Visit (INDEPENDENT_AMBULATORY_CARE_PROVIDER_SITE_OTHER): Payer: BC Managed Care – PPO

## 2012-12-18 ENCOUNTER — Ambulatory Visit (INDEPENDENT_AMBULATORY_CARE_PROVIDER_SITE_OTHER): Payer: BC Managed Care – PPO | Admitting: Internal Medicine

## 2012-12-18 VITALS — BP 130/70 | HR 61 | Temp 97.1°F | Ht 66.0 in | Wt 147.0 lb

## 2012-12-18 DIAGNOSIS — C549 Malignant neoplasm of corpus uteri, unspecified: Secondary | ICD-10-CM

## 2012-12-18 DIAGNOSIS — Z23 Encounter for immunization: Secondary | ICD-10-CM

## 2012-12-18 DIAGNOSIS — E785 Hyperlipidemia, unspecified: Secondary | ICD-10-CM

## 2012-12-18 DIAGNOSIS — Z0000 Encounter for general adult medical examination without abnormal findings: Secondary | ICD-10-CM

## 2012-12-18 DIAGNOSIS — R739 Hyperglycemia, unspecified: Secondary | ICD-10-CM

## 2012-12-18 DIAGNOSIS — R7309 Other abnormal glucose: Secondary | ICD-10-CM

## 2012-12-18 DIAGNOSIS — G4733 Obstructive sleep apnea (adult) (pediatric): Secondary | ICD-10-CM

## 2012-12-18 DIAGNOSIS — J309 Allergic rhinitis, unspecified: Secondary | ICD-10-CM

## 2012-12-18 LAB — COMPREHENSIVE METABOLIC PANEL
ALT: 22 U/L (ref 0–35)
AST: 18 U/L (ref 0–37)
Albumin: 4.3 g/dL (ref 3.5–5.2)
Alkaline Phosphatase: 59 U/L (ref 39–117)
BUN: 15 mg/dL (ref 6–23)
CO2: 28 mEq/L (ref 19–32)
Calcium: 9.3 mg/dL (ref 8.4–10.5)
Chloride: 100 mEq/L (ref 96–112)
Creatinine, Ser: 0.8 mg/dL (ref 0.4–1.2)
GFR: 78.27 mL/min (ref >60.00)
Glucose, Bld: 95 mg/dL (ref 70–99)
Potassium: 4.3 mEq/L (ref 3.5–5.1)
Sodium: 134 mEq/L — ABNORMAL LOW (ref 135–145)
Total Bilirubin: 0.6 mg/dL (ref 0.3–1.2)
Total Protein: 7.7 g/dL (ref 6.0–8.3)

## 2012-12-18 LAB — HEPATIC FUNCTION PANEL
ALT: 22 U/L (ref 0–35)
AST: 18 U/L (ref 0–37)
Albumin: 4.3 g/dL (ref 3.5–5.2)
Alkaline Phosphatase: 59 U/L (ref 39–117)
Bilirubin, Direct: 0.1 mg/dL (ref 0.0–0.3)
Total Bilirubin: 0.6 mg/dL (ref 0.3–1.2)
Total Protein: 7.7 g/dL (ref 6.0–8.3)

## 2012-12-18 LAB — HEMOGLOBIN A1C: Hgb A1c MFr Bld: 6.3 % (ref 4.6–6.5)

## 2012-12-18 LAB — LIPID PANEL
Cholesterol: 213 mg/dL — ABNORMAL HIGH (ref 0–200)
HDL: 65.6 mg/dL (ref >39.00)
Total CHOL/HDL Ratio: 3
Triglycerides: 93 mg/dL (ref 0.0–149.0)
VLDL: 18.6 mg/dL (ref 0.0–40.0)

## 2012-12-18 LAB — LDL CHOLESTEROL, DIRECT: Direct LDL: 130.7 mg/dL

## 2012-12-18 MED ORDER — MONTELUKAST SODIUM 10 MG PO TABS: 10.0000 mg | ORAL_TABLET | Freq: Every day | ORAL | Status: DC

## 2012-12-18 MED ORDER — ATORVASTATIN CALCIUM 20 MG PO TABS: 20.0000 mg | ORAL_TABLET | Freq: Every day | ORAL | Status: DC

## 2012-12-18 NOTE — Progress Notes (Signed)
Pre visit review using our clinic review tool, if applicable. No additional management support is needed unless otherwise documented below in the visit note. 

## 2012-12-18 NOTE — Progress Notes (Signed)
Subjective:     Patient ID: Diana Hampton, female   DOB: December 26, 1950, 62 y.o.   MRN: [id number]  HPI Ms. Camarena is a 62 yo female with PMH of OSA, asthma and hypercholesteremia who presents for an annual physical.    She is using her CPAP regularly at night. She is breathing well and using her inhalers as prescribed. Montelukast has reduced the frequency she has to use her inhaler. She is taking her other medications as prescribed.    She is losing weight. Her husband and her are eating well and exercising regularly. She is enrolled in a program at work encourages her to exercise and eat well. She tries to prioritize eating vegetables and fruits. She is trying to stop drinking soft drinks (currently drinks one a week). She is walking stairs and exercising with her husband.  She saw her eye doctor and dentist earlier this year.   Social: She enjoys her job and plans to retire when she is 85. No EtOH, smoking, or any other drug use.  Past Medical History  Diagnosis Date  . Malignant neoplasm of corpus uteri, except isthmus   . Allergy   . Hyperlipidemia    Past Surgical History  Procedure Laterality Date  . Abdominal hysterectomy  2000  . Oophorectomy  2000   Family History  Problem Relation Age of Onset  . Hypertension Mother   . Cancer Father 1    Colon  . Heart disease Brother 66    MI  . Diabetes Other     Grandfather   History   Social History  . Marital Status: Married    Spouse Name: N/A    Number of Children: 2  . Years of Education: 14   Occupational History  . 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 Smoking status: Never Smoker   . Smokeless tobacco: Never Used  . Alcohol Use: No  . Drug Use: No  . Sexual Activity: Yes    Partners: Male   Other Topics Concern  . 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 1  Son 21, 1 daughter 1977-deceased '75 OD (she has had counseling); 5 grandchildren (3 biological, 2 step).  44 y/o granddaughter lives with her, 44 y/o every weekend. Marriage in good health.     Current Outpatient Prescriptions on File Prior to Visit  Medication Sig Dispense Refill  . albuterol (PROVENTIL HFA;VENTOLIN HFA) 108 (90 BASE) MCG/ACT inhaler Inhale 2 puffs into the lungs every 6 (six) hours as needed for wheezing.       No current facility-administered medications on file prior to visit.       In the interim: no updates in healthcare.   Review of Systems Negative except as mentioned above.  + Tinnitus for several years duration + Hot flashes/ night sweats at night + Urinary frequency      Objective:   Physical Exam HEENT:  Antiicteric sclerae. EOM intact without nystagmus. Tympanic membranes clear. Nares patent. MMM. Non-erythematous oropharynx.  CV: RRR. No murmurs, rubs or gallops. 2+ radial pulses bilaterally. Pulm: Lungs clear to auscultation bilaterally. No crackles, wheezes or rhonci. Normal work of breathing. Abd: Soft, non-tender, non-distended.  Breast: No nodules or masses palpated. No axillary lymphadenopathy.  Pelvic - deferred, not due Rectal - deferred Ext - no C/C/E, no deformity Neuro - A&O  x 3, CN II-XII grossly intact, normal "get up and go," normal gait  Recent Results (from the past 2160 hour(s))  HEMOGLOBIN A1C     Status: None   Collection Time    12/18/12 10:15 AM      Result Value Range   Hemoglobin A1C 6.3  4.6 - 6.5 %   Comment: Glycemic Control Guidelines for People with Diabetes:Non Diabetic:  <6%Goal of Therapy: <7%Additional Action Suggested:  >8%   LIPID PANEL     Status: Abnormal   Collection Time    12/18/12 10:15 AM      Result Value Range   Cholesterol 213 (*) 0 - 200 mg/dL   Comment: ATP III Classification       Desirable:  < 200 mg/dL               Borderline High:  200 - 239 mg/dL          High:  > = 657 mg/dL    Triglycerides 84.6  0.0 - 149.0 mg/dL   Comment: Normal:  <962 mg/dLBorderline High:  150 - 199 mg/dL   HDL 95.28  >41.32 mg/dL   VLDL 44.0  0.0 - 10.2 mg/dL   Total CHOL/HDL Ratio 3     Comment:                Men          Women1/2 Average Risk     3.4          3.3Average Risk          5.0          4.42X Average Risk          9.6          7.13X Average Risk          15.0          11.0                      HEPATIC FUNCTION PANEL     Status: None   Collection Time    12/18/12 10:15 AM      Result Value Range   Total Bilirubin 0.6  0.3 - 1.2 mg/dL   Bilirubin, Direct 0.1  0.0 - 0.3 mg/dL   Alkaline Phosphatase 59  39 - 117 U/L   AST 18  0 - 37 U/L   ALT 22  0 - 35 U/L   Total Protein 7.7  6.0 - 8.3 g/dL   Albumin 4.3  3.5 - 5.2 g/dL  COMPREHENSIVE METABOLIC PANEL     Status: Abnormal   Collection Time    12/18/12 10:15 AM      Result Value Range   Sodium 134 (*) 135 - 145 mEq/L   Potassium 4.3  3.5 - 5.1 mEq/L   Chloride 100  96 - 112 mEq/L   CO2 28  19 - 32 mEq/L   Glucose, Bld 95  70 - 99 mg/dL   BUN 15  6 - 23 mg/dL   Creatinine, Ser 0.8  0.4 - 1.2 mg/dL   Total Bilirubin 0.6  0.3 - 1.2 mg/dL   Alkaline Phosphatase 59  39 - 117 U/L   AST 18  0 - 37 U/L   ALT 22  0 - 35 U/L   Total Protein 7.7  6.0 - 8.3 g/dL   Albumin 4.3  3.5 - 5.2 g/dL   Calcium 9.3  8.4 - 72.5 mg/dL  GFR 78.27  >60.00 mL/min  LDL CHOLESTEROL, DIRECT     Status: None   Collection Time    12/18/12 10:15 AM      Result Value Range   Direct LDL 130.7     Comment: Optimal:  <100 mg/dLNear or Above Optimal:  100-129 mg/dLBorderline High:  130-159 mg/dLHigh:  160-189 mg/dLVery High:  >190 mg/dL        Assessment and Plan:     Ms. Mcallister is a 62 yo female with PMH of OSA, asthma and hypercholesteremia who presents for an annual physical.    1) Hyperglycemia/Prediabetes: - CMP, A1c ordered to follow up on her glucose.    2) Hypercholesteremia:  - Lipid and hepatic panel to follow these values since  she is on a statin  3) Gynecologist: She hasn't seen the gynecologist since 2012. Advised patient to see gynecologist to see gynecologist in 2015/16 for a pelvic exam.   4) Patient received a Tdap vaccine today.  5) Advised patient to check whether her insurance company covers the shingles vaccine.

## 2012-12-18 NOTE — Patient Instructions (Signed)
Pleasure seeing you today Diana Hampton!  1) You had some blood drawn today to check your blood sugar, liver function and lipid levels. You will be sent a copy of these results or you can view them on my chart.   2) You should consider getting a pelvic exam again in 2015/2016, since you last had an exam in 2012.   3) You received a Tdap vaccine today (tetanus, diptheria and pertussis vaccine).   4) Please check with your insurance regarding whether they cover the shingles vaccine. The shingles vaccine is effective at reducing the chance you will develop shingles. If your insurance doesn't cover the vaccine, the cost could be $300. You won't receive the vaccine until we know your insurance covers the vaccine.

## 2012-12-19 NOTE — Assessment & Plan Note (Signed)
Using CPAP. Follows with pulmonary as needed.

## 2012-12-19 NOTE — Assessment & Plan Note (Signed)
Interval history is benign w/o major illness, surgery or injury. Physical exam is normal. Lab results reviewed: A1C is normal, lipids are ok, LFTs normal, chemistries normal. She is current with colorectal and breast cancer screening. Immunization - brought up to date.  In summary A very nice woman who appears medically stable and she is taking positive steps to maintain and improve her health. She will return in 1 year, sooner prn.

## 2012-12-19 NOTE — Assessment & Plan Note (Signed)
Taking and tolerating "statin" therapy. Labs order - recommendations pending.  Addendum: HDL is excellent, LDL just at goal of 130. LDL/HDL = 2.1  Plan Continue present regimen

## 2012-12-19 NOTE — Assessment & Plan Note (Signed)
Symptoms are well controlled 

## 2012-12-19 NOTE — Assessment & Plan Note (Signed)
Last pelvic w/ gyn approx 2 years ago.  Plan Vulvo-vaginal exam every 3-5 years. Suggest exam in 2015

## 2013-01-11 ENCOUNTER — Ambulatory Visit (INDEPENDENT_AMBULATORY_CARE_PROVIDER_SITE_OTHER): Payer: BC Managed Care – PPO | Admitting: Physician Assistant

## 2013-01-11 ENCOUNTER — Ambulatory Visit: Payer: BC Managed Care – PPO | Admitting: Family Medicine

## 2013-01-11 VITALS — BP 110/70 | HR 79 | Temp 98.9°F | Resp 16 | Ht 66.0 in | Wt 149.0 lb

## 2013-01-11 DIAGNOSIS — J111 Influenza due to unidentified influenza virus with other respiratory manifestations: Secondary | ICD-10-CM

## 2013-01-11 DIAGNOSIS — J329 Chronic sinusitis, unspecified: Secondary | ICD-10-CM

## 2013-01-11 DIAGNOSIS — R6889 Other general symptoms and signs: Secondary | ICD-10-CM

## 2013-01-11 DIAGNOSIS — J101 Influenza due to other identified influenza virus with other respiratory manifestations: Secondary | ICD-10-CM

## 2013-01-11 LAB — POCT INFLUENZA A/B
Influenza A, POC: POSITIVE
Influenza B, POC: NEGATIVE

## 2013-01-11 MED ORDER — HYDROCOD POLST-CHLORPHEN POLST 10-8 MG/5ML PO LQCR: 5.0000 mL | Freq: Two times a day (BID) | ORAL | Status: AC

## 2013-01-11 MED ORDER — AMOXICILLIN 875 MG PO TABS: 875.0000 mg | ORAL_TABLET | Freq: Two times a day (BID) | ORAL | Status: DC

## 2013-01-11 MED ORDER — GUAIFENESIN ER 1200 MG PO TB12
1.0000 | ORAL_TABLET | Freq: Two times a day (BID) | ORAL | Status: AC
Start: 2013-01-11 — End: 2013-01-18

## 2013-01-11 MED ORDER — OSELTAMIVIR PHOSPHATE 75 MG PO CAPS: 75.0000 mg | ORAL_CAPSULE | Freq: Two times a day (BID) | ORAL | Status: AC

## 2013-01-11 NOTE — Patient Instructions (Signed)
No sudafed. Ok to use a couple of days of afrin right before CPAP use Tylenol 500mg  2 pills every 6h Motrin 200mg  - take 4 pills every 8h

## 2013-01-11 NOTE — Progress Notes (Signed)
   Subjective:    Patient ID: Diana Hampton, female    DOB: Jun 13, 1950, 63 y.o.   MRN: [id number]  HPI Pt presents to clinic with 2 weeks h/o cold symptoms.  She started like a cold but she has not gotten much better - and last night was terrible - she was up many times throughout the night and this am she has face pain and teeth pain and chills but no fever.  She hurts all over.  OTC meds - cold preps Sick contacts - grandchildren about 2 weeks ago Flu vaccine Oct Review of Systems  Constitutional: Positive for chills. Negative for fever.  HENT: Positive for congestion, postnasal drip and rhinorrhea (clear - minimal).   Gastrointestinal: Positive for nausea. Negative for vomiting and diarrhea.  Musculoskeletal: Positive for myalgias.  Neurological: Positive for headaches.       Objective:   Physical Exam  Vitals reviewed. Constitutional: She is oriented to person, place, and time. She appears well-developed and well-nourished.  Pt appears like she does not feel well.  HENT:  Head: Normocephalic and atraumatic.  Right Ear: Hearing, tympanic membrane, external ear and ear canal normal.  Left Ear: Hearing, tympanic membrane, external ear and ear canal normal.  Nose: Nose normal.  Mouth/Throat: Uvula is midline, oropharynx is clear and moist and mucous membranes are normal.  Eyes: Conjunctivae are normal.  Cardiovascular: Normal rate, regular rhythm and normal heart sounds.   Pulmonary/Chest: Effort normal and breath sounds normal.  Neurological: She is alert and oriented to person, place, and time.  Skin: Skin is warm and dry.  Psychiatric: She has a normal mood and affect. Her behavior is normal. Judgment and thought content normal.   Results for orders placed in visit on 01/11/13  POCT INFLUENZA A/B      Result Value Range   Influenza A, POC Positive     Influenza B, POC            Assessment & Plan:  Flu-like symptoms - Plan: POCT Influenza A/B,  chlorpheniramine-HYDROcodone (TUSSIONEX PENNKINETIC ER) 10-8 MG/5ML LQCR  Influenza A - Plan: oseltamivir (TAMIFLU) 75 MG capsule  Sinus infection - Plan: Guaifenesin (MUCINEX MAXIMUM STRENGTH) 1200 MG TB12, amoxicillin (AMOXIL) 875 MG tablet  URI symptomatic care d/w pt.  Will treat for sinus infection and flu.  She will be out of work for 2-3 days due to flu.  Windell Hummingbird PA-C 01/11/2013 11:38 AM

## 2013-05-16 ENCOUNTER — Telehealth: Payer: Self-pay | Admitting: Internal Medicine

## 2013-05-16 NOTE — Telephone Encounter (Signed)
Left a message for patient to call back so we can schedule her for a Colonoscopy.

## 2013-05-17 ENCOUNTER — Encounter: Payer: Self-pay | Admitting: Internal Medicine

## 2013-07-08 ENCOUNTER — Ambulatory Visit (AMBULATORY_SURGERY_CENTER): Payer: Self-pay

## 2013-07-08 VITALS — Ht 66.0 in | Wt 148.0 lb

## 2013-07-08 DIAGNOSIS — Z800 Family history of malignant neoplasm of digestive organs: Secondary | ICD-10-CM

## 2013-07-08 MED ORDER — MOVIPREP 100 G PO SOLR: 1.0000 | Freq: Once | ORAL | Status: DC

## 2013-07-08 NOTE — Progress Notes (Signed)
No allergies to eggs or soy No past problems with anesthesia No home oxygen No diet/weight loss meds  Has email  Emmi instructions given for colonoscopy 

## 2013-07-09 ENCOUNTER — Encounter: Payer: Self-pay | Admitting: Internal Medicine

## 2013-07-18 ENCOUNTER — Ambulatory Visit: Payer: BC Managed Care – PPO | Admitting: Pulmonary Disease

## 2013-07-23 ENCOUNTER — Encounter: Payer: Self-pay | Admitting: Pulmonary Disease

## 2013-07-23 ENCOUNTER — Ambulatory Visit (INDEPENDENT_AMBULATORY_CARE_PROVIDER_SITE_OTHER): Payer: BC Managed Care – PPO | Admitting: Pulmonary Disease

## 2013-07-23 VITALS — BP 130/78 | HR 69 | Temp 97.8°F | Ht 66.0 in | Wt 152.8 lb

## 2013-07-23 DIAGNOSIS — G4733 Obstructive sleep apnea (adult) (pediatric): Secondary | ICD-10-CM

## 2013-07-23 NOTE — Assessment & Plan Note (Signed)
The patient feels that overall she is doing well on CPAP, but wishes to consider a full face mask. I have recommended that she consider a mask fitting session at the sleep Center, and she is agreeable. I've asked her to continue with her CPAP usage, and to keep up with her mask changes and supplies.

## 2013-07-23 NOTE — Progress Notes (Signed)
   Subjective:    Patient ID: Diana Hampton, female    DOB: July 29, 1950, 63 y.o.   MRN: [id number]  HPI Patient comes in today for followup of her obstructive sleep apnea. She tells me that she is wearing CPAP compliantly, and is having no issues with her pressure. She is having issues with mouth opening, as well as discomfort with her nasal mask. She uses a chin strap at times, but has difficulties with this as well. Overall, she does feel it is helping her sleep and daytime alertness.   Review of Systems  Constitutional: Negative for fever and unexpected weight change.  HENT: Negative for congestion, dental problem, ear pain, nosebleeds, postnasal drip, rhinorrhea, sinus pressure, sneezing, sore throat and trouble swallowing.   Eyes: Negative for redness and itching.  Respiratory: Negative for cough, chest tightness, shortness of breath and wheezing.   Cardiovascular: Negative for palpitations and leg swelling.  Gastrointestinal: Negative for nausea and vomiting.  Genitourinary: Negative for dysuria.  Musculoskeletal: Negative for joint swelling.  Skin: Negative for rash.  Neurological: Negative for headaches.  Hematological: Does not bruise/bleed easily.  Psychiatric/Behavioral: Negative for dysphoric mood. The patient is not nervous/anxious.        Objective:   Physical Exam Well-developed female in no acute distress Nose without purulence or discharge noted No skin breakdown or pressure necrosis from the CPAP mask Neck without lymphadenopathy or thyromegaly Lower extremities without edema, no cyanosis Alert and oriented, moves all 4 extremities.       Assessment & Plan:

## 2013-07-23 NOTE — Patient Instructions (Addendum)
Continue with cpap, and keep up with mask changes and supplies. Think about mask fitting at sleep center, and we can arrange. followup with me again in one year.

## 2013-07-24 ENCOUNTER — Ambulatory Visit (HOSPITAL_BASED_OUTPATIENT_CLINIC_OR_DEPARTMENT_OTHER): Payer: BC Managed Care – PPO | Attending: Pulmonary Disease | Admitting: Radiology

## 2013-07-24 DIAGNOSIS — Z9989 Dependence on other enabling machines and devices: Secondary | ICD-10-CM

## 2013-07-24 DIAGNOSIS — G4733 Obstructive sleep apnea (adult) (pediatric): Secondary | ICD-10-CM

## 2013-07-29 ENCOUNTER — Ambulatory Visit (AMBULATORY_SURGERY_CENTER): Payer: BC Managed Care – PPO | Admitting: Internal Medicine

## 2013-07-29 ENCOUNTER — Encounter: Payer: Self-pay | Admitting: Internal Medicine

## 2013-07-29 VITALS — BP 108/68 | HR 56 | Temp 98.1°F | Resp 15 | Ht 66.0 in | Wt 148.0 lb

## 2013-07-29 DIAGNOSIS — Z800 Family history of malignant neoplasm of digestive organs: Secondary | ICD-10-CM

## 2013-07-29 DIAGNOSIS — Z1211 Encounter for screening for malignant neoplasm of colon: Secondary | ICD-10-CM

## 2013-07-29 MED ORDER — SODIUM CHLORIDE 0.9 % IV SOLN
500.0000 mL | INTRAVENOUS | Status: DC
Start: 2013-07-29 — End: 2013-07-29

## 2013-07-29 NOTE — Patient Instructions (Signed)
YOU HAD AN ENDOSCOPIC PROCEDURE TODAY AT THE [facility]: Refer to the procedure report that was given to you for any specific questions about what was found during the examination.  If the procedure report does not answer your questions, please call your gastroenterologist to clarify.  If you requested that your care partner not be given the details of your procedure findings, then the procedure report has been included in a sealed envelope for you to review at your convenience later.  YOU SHOULD EXPECT: Some feelings of bloating in the abdomen. Passage of more gas than usual.  Walking can help get rid of the air that was put into your GI tract during the procedure and reduce the bloating. If you had a lower endoscopy (such as a colonoscopy or flexible sigmoidoscopy) you may notice spotting of blood in your stool or on the toilet paper. If you underwent a bowel prep for your procedure, then you may not have a normal bowel movement for a few days.  DIET: Your first meal following the procedure should be a light meal and then it is ok to progress to your normal diet.  A half-sandwich or bowl of soup is an example of a good first meal.  Heavy or fried foods are harder to digest and may make you feel nauseous or bloated.  Likewise meals heavy in dairy and vegetables can cause extra gas to form and this can also increase the bloating.  Drink plenty of fluids but you should avoid alcoholic beverages for 24 hours.  ACTIVITY: Your care partner should take you home directly after the procedure.  You should plan to take it easy, moving slowly for the rest of the day.  You can resume normal activity the day after the procedure however you should NOT DRIVE or use heavy machinery for 24 hours (because of the sedation medicines used during the test).    SYMPTOMS TO REPORT IMMEDIATELY: A gastroenterologist can be reached at any hour.  During normal business hours, 8:30 AM to 5:00 PM Monday through Friday,  call [phone].  After hours and on weekends, please call the GI answering service at [phone] who will take a message and have the physician on call contact you.   Following lower endoscopy (colonoscopy or flexible sigmoidoscopy):  Excessive amounts of blood in the stool  Significant tenderness or worsening of abdominal pains  Swelling of the abdomen that is new, acute  Fever of 100F or higher  FOLLOW UP: If any biopsies were taken you will be contacted by phone or by letter within the next 1-3 weeks.  Call your gastroenterologist if you have not heard about the biopsies in 3 weeks.  Our staff will call the home number listed on your records the next business day following your procedure to check on you and address any questions or concerns that you may have at that time regarding the information given to you following your procedure. This is a courtesy call and so if there is no answer at the home number and we have not heard from you through the emergency physician on call, we will assume that you have returned to your regular daily activities without incident.  SIGNATURES/CONFIDENTIALITY: You and/or your care partner have signed paperwork which will be entered into your electronic medical record.  These signatures attest to the fact that that the information above on your After Visit Summary has been reviewed and is understood.  Full responsibility of the confidentiality of this   discharge information lies with you and/or your care-partner.  Resume medications. Information given on diverticulosis and high fiber diet with discharge instructions. 

## 2013-07-29 NOTE — Progress Notes (Signed)
Procedure ends, to recovery, report given and VSS. 

## 2013-07-29 NOTE — Op Note (Signed)
[facility]  [address]. [place], [address]   COLONOSCOPY PROCEDURE REPORT  PATIENT: Diana Hampton, Diana Hampton  MR#: [id number] BIRTHDATE: 1950-11-08 , 63  yrs. old GENDER: Female ENDOSCOPIST: Jerene Bears, MD REFERRED ON:GEXBMWU Marquis Lunch, M.D. PROCEDURE DATE:  07/29/2013 PROCEDURE:   Colonoscopy, screening First Screening Colonoscopy - Avg.  risk and is 50 yrs.  old or older - No.  Prior Negative Screening - Now for repeat screening. 10 or more years since last screening  History of Adenoma - Now for follow-up colonoscopy & has been > or = to 3 yrs.  N/A  Polyps Removed Today? No.  Recommend repeat exam, <10 yrs? Yes.  High risk (family or personal hx). ASA CLASS:   Class II INDICATIONS:elevated risk screening and Patient's immediate family history of colon cancer. MEDICATIONS: MAC sedation, administered by CRNA and propofol (Diprivan) 200mg  IV  DESCRIPTION OF PROCEDURE:   After the risks benefits and alternatives of the procedure were thoroughly explained, informed consent was obtained.  A digital rectal exam revealed no rectal mass.   The LB [id number]  endoscope was introduced through the anus and advanced to the cecum, which was identified by both the appendix and ileocecal valve. No adverse events experienced. The quality of the prep was good, using MoviPrep  The instrument was then slowly withdrawn as the colon was fully examined.   COLON FINDINGS: Mild diverticulosis was noted in the sigmoid colon. The colon was otherwise normal.  There was no inflammation, polyps or cancers seen.  Retroflexed views revealed small internal hemorrhoids. The time to cecum=3 minutes 57 seconds.  Withdrawal time=10 minutes 03 seconds.  The scope was withdrawn and the procedure completed.  COMPLICATIONS: There were no complications.  ENDOSCOPIC IMPRESSION: 1.   Mild diverticulosis was noted in the sigmoid colon 2.   The colon was otherwise normal  RECOMMENDATIONS: 1.   High fiber diet 2.  Given your significant family history of colon cancer, you should have a repeat colonoscopy in 5 years   eSigned:  Jerene Bears, MD 07/29/2013 9:34 AM   cc: The Patient and Neena Rhymes, MD

## 2013-07-30 ENCOUNTER — Telehealth: Payer: Self-pay

## 2013-07-30 NOTE — Telephone Encounter (Signed)
Left a message at [phone] for the pt to call back if any questions or concerns. Maw

## 2013-09-30 ENCOUNTER — Other Ambulatory Visit: Payer: Self-pay

## 2013-09-30 MED ORDER — ATORVASTATIN CALCIUM 20 MG PO TABS: 20.0000 mg | ORAL_TABLET | Freq: Every day | ORAL | Status: DC

## 2013-09-30 MED ORDER — MONTELUKAST SODIUM 10 MG PO TABS: 10.0000 mg | ORAL_TABLET | Freq: Every day | ORAL | Status: DC

## 2013-09-30 NOTE — Telephone Encounter (Signed)
Former patient of Dr Linda Hedges No up coming appt with provider here Last labs 12/18/12 Request for mail order 90 days

## 2013-09-30 NOTE — Telephone Encounter (Signed)
These prescriptions will be refilled one time by me for 30 days; it will be necessary for her to establish with a primary care physician for additional refills.

## 2014-01-06 LAB — HM MAMMOGRAPHY: HM Mammogram: NEGATIVE

## 2014-02-17 ENCOUNTER — Encounter: Payer: Self-pay | Admitting: Internal Medicine

## 2014-06-20 ENCOUNTER — Encounter (INDEPENDENT_AMBULATORY_CARE_PROVIDER_SITE_OTHER): Payer: BLUE CROSS/BLUE SHIELD | Admitting: Family

## 2014-06-20 NOTE — Progress Notes (Signed)
Error

## 2014-06-24 ENCOUNTER — Encounter: Payer: Self-pay | Admitting: Family

## 2014-06-24 ENCOUNTER — Ambulatory Visit (INDEPENDENT_AMBULATORY_CARE_PROVIDER_SITE_OTHER): Payer: BLUE CROSS/BLUE SHIELD | Admitting: Family

## 2014-06-24 ENCOUNTER — Other Ambulatory Visit (INDEPENDENT_AMBULATORY_CARE_PROVIDER_SITE_OTHER): Payer: BLUE CROSS/BLUE SHIELD

## 2014-06-24 VITALS — BP 122/82 | HR 65 | Temp 97.7°F | Resp 18 | Ht 66.0 in | Wt 155.1 lb

## 2014-06-24 DIAGNOSIS — K219 Gastro-esophageal reflux disease without esophagitis: Secondary | ICD-10-CM | POA: Diagnosis not present

## 2014-06-24 DIAGNOSIS — E785 Hyperlipidemia, unspecified: Secondary | ICD-10-CM

## 2014-06-24 DIAGNOSIS — J302 Other seasonal allergic rhinitis: Secondary | ICD-10-CM

## 2014-06-24 LAB — LIPID PANEL
Cholesterol: 203 mg/dL — ABNORMAL HIGH (ref 0–200)
HDL: 53.9 mg/dL (ref >39.00)
LDL Cholesterol: 112 mg/dL — ABNORMAL HIGH (ref 0–99)
NonHDL: 149.1
Total CHOL/HDL Ratio: 4
Triglycerides: 187 mg/dL — ABNORMAL HIGH (ref 0.0–149.0)
VLDL: 37.4 mg/dL (ref 0.0–40.0)

## 2014-06-24 LAB — COMPREHENSIVE METABOLIC PANEL
ALT: 17 U/L (ref 0–35)
AST: 13 U/L (ref 0–37)
Albumin: 4 g/dL (ref 3.5–5.2)
Alkaline Phosphatase: 63 U/L (ref 39–117)
BUN: 14 mg/dL (ref 6–23)
CO2: 30 mEq/L (ref 19–32)
Calcium: 9.3 mg/dL (ref 8.4–10.5)
Chloride: 104 mEq/L (ref 96–112)
Creatinine, Ser: 0.86 mg/dL (ref 0.40–1.20)
GFR: 70.62 mL/min (ref >60.00)
Glucose, Bld: 90 mg/dL (ref 70–99)
Potassium: 4.2 mEq/L (ref 3.5–5.1)
Sodium: 139 mEq/L (ref 135–145)
Total Bilirubin: 0.3 mg/dL (ref 0.2–1.2)
Total Protein: 7 g/dL (ref 6.0–8.3)

## 2014-06-24 MED ORDER — ATORVASTATIN CALCIUM 20 MG PO TABS: 20.0000 mg | ORAL_TABLET | Freq: Every day | ORAL | Status: DC

## 2014-06-24 MED ORDER — MONTELUKAST SODIUM 10 MG PO TABS: 10.0000 mg | ORAL_TABLET | Freq: Every day | ORAL | Status: DC

## 2014-06-24 NOTE — Assessment & Plan Note (Signed)
Stable with current dosage of atorvastatin. Denies adverse side effects or myalgias. Obtain lipid profile for updated cholesterol levels and complete metabolic panel to check liver and kidney function. Continue current dosage of atorvastatin.

## 2014-06-24 NOTE — Progress Notes (Signed)
Pre visit review using our clinic review tool, if applicable. No additional management support is needed unless otherwise documented below in the visit note. 

## 2014-06-24 NOTE — Assessment & Plan Note (Signed)
Stable with current over-the-counter regimen of esomeprazole. Denies adverse side effects. Continue current dosage of as esomeprazole.

## 2014-06-24 NOTE — Progress Notes (Signed)
Subjective:    Patient ID: Diana Hampton, female    DOB: 1950/05/07, 64 y.o.   MRN: [id number]  Chief Complaint  Patient presents with  . Establish Care    needs refills on both medications    HPI:  Diana Hampton is a 64 y.o. female with a PMH of hyperlipidemia, seasonal allergies, obstructive sleep apnea, asthma, GERD, and hysterectomy who presents today for an office follow-up.  1.) Seasonal allergies - stable with current regimen of montelukast. Takes medication as prescribed. Denies adverse side effects.    2.) Hyperlipidemia - currently maintained on atorvastatin. Takes the medication as prescribed and denies adverse side effects or myalgias.  Lab Results  Component Value Date   CHOL 213* 12/18/2012   HDL 65.60 12/18/2012   LDLCALC 87 11/18/2010   LDLDIRECT 130.7 12/18/2012   TRIG 93.0 12/18/2012   CHOLHDL 3 12/18/2012    3.) GERD - Currently stable and maintained on OTC esomeprazole. Denies adverse side effects of the medication and takes it per the directions.    No Known Allergies   No current outpatient prescriptions on file prior to visit.   No current facility-administered medications on file prior to visit.   Past Medical History  Diagnosis Date  . Malignant neoplasm of corpus uteri, except isthmus   . Allergy   . Hyperlipidemia   . Asthma   . GERD (gastroesophageal reflux disease)   . Sleep apnea     c- pap wears    Review of Systems  Eyes:       Negative for changes in eyesight  Respiratory: Negative for chest tightness and shortness of breath.   Cardiovascular: Negative for chest pain, palpitations and leg swelling.  Musculoskeletal: Negative for myalgias.      Objective:    BP 122/82 mmHg  Pulse 65  Temp(Src) 97.7 F (36.5 C) (Oral)  Resp 18  Ht 5' 6"  (1.676 m)  Wt 155 lb 1.9 oz (70.362 kg)  BMI 25.05 kg/m2  SpO2 95% Nursing note and vital signs reviewed.  Physical Exam  Constitutional: She is oriented to person, place,  and time. She appears well-developed and well-nourished. No distress.  Cardiovascular: Normal rate, regular rhythm, normal heart sounds and intact distal pulses.   Pulmonary/Chest: Effort normal and breath sounds normal.  Neurological: She is alert and oriented to person, place, and time.  Skin: Skin is warm and dry.  Psychiatric: She has a normal mood and affect. Her behavior is normal. Judgment and thought content normal.       Assessment & Plan:   Problem List Items Addressed This Visit      Respiratory   Seasonal allergies - Primary    Stable with current regimen of montelukast. Takes her medications as prescribed and denies adverse side effects. she misses her medications for multiple days she becomes congested. continue current dosage of singulair.      Relevant Medications   montelukast (SINGULAIR) 10 MG tablet     Digestive   GERD (gastroesophageal reflux disease)    Stable with current over-the-counter regimen of esomeprazole. Denies adverse side effects. Continue current dosage of as esomeprazole.      Relevant Medications   esomeprazole (NEXIUM) 20 MG capsule     Other   Hyperlipidemia    Stable with current dosage of atorvastatin. Denies adverse side effects or myalgias. Obtain lipid profile for updated cholesterol levels and complete metabolic panel to check liver and kidney function. Continue current dosage of atorvastatin.  Relevant Medications   atorvastatin (LIPITOR) 20 MG tablet   Other Relevant Orders   Comp Met (CMET)   Lipid Profile

## 2014-06-24 NOTE — Patient Instructions (Signed)
Thank you for choosing [facility].  Summary/Instructions:  Please continue to take your medications as prescribed.   Your prescription(s) have been submitted to your pharmacy or been printed and provided for you. Please take as directed and contact our office if you believe you are having problem(s) with the medication(s) or have any questions.  Please stop by the lab on the basement level of the building for your blood work. Your results will be released to MyChart (or called to you) after review, usually within 72 hours after test completion. If any changes need to be made, you will be notified at that same time.  If your symptoms worsen or fail to improve, please contact our office for further instruction, or in case of emergency go directly to the emergency room at the closest medical facility.     

## 2014-06-24 NOTE — Assessment & Plan Note (Signed)
Stable with current regimen of montelukast. Takes her medications as prescribed and denies adverse side effects. she misses her medications for multiple days she becomes congested. continue current dosage of singulair.

## 2014-06-25 ENCOUNTER — Encounter: Payer: Self-pay | Admitting: Family

## 2014-07-15 ENCOUNTER — Ambulatory Visit: Payer: BLUE CROSS/BLUE SHIELD | Admitting: Pulmonary Disease

## 2014-07-24 ENCOUNTER — Ambulatory Visit: Payer: BC Managed Care – PPO | Admitting: Pulmonary Disease

## 2014-09-10 ENCOUNTER — Ambulatory Visit: Payer: BLUE CROSS/BLUE SHIELD | Admitting: Pulmonary Disease

## 2015-01-26 LAB — HM DEXA SCAN

## 2015-01-26 LAB — HM MAMMOGRAPHY

## 2015-02-02 ENCOUNTER — Encounter: Payer: Self-pay | Admitting: Family

## 2015-02-07 ENCOUNTER — Ambulatory Visit (INDEPENDENT_AMBULATORY_CARE_PROVIDER_SITE_OTHER): Payer: BLUE CROSS/BLUE SHIELD | Admitting: Family Medicine

## 2015-02-07 VITALS — BP 134/80 | HR 89 | Temp 97.8°F | Resp 16 | Ht 66.0 in | Wt 153.4 lb

## 2015-02-07 DIAGNOSIS — J069 Acute upper respiratory infection, unspecified: Secondary | ICD-10-CM | POA: Diagnosis not present

## 2015-02-07 DIAGNOSIS — R05 Cough: Secondary | ICD-10-CM

## 2015-02-07 DIAGNOSIS — J029 Acute pharyngitis, unspecified: Secondary | ICD-10-CM

## 2015-02-07 DIAGNOSIS — R059 Cough, unspecified: Secondary | ICD-10-CM

## 2015-02-07 LAB — POCT RAPID STREP A (OFFICE): Rapid Strep A Screen: NEGATIVE

## 2015-02-07 MED ORDER — AMOXICILLIN 500 MG PO CAPS: 500.0000 mg | ORAL_CAPSULE | Freq: Two times a day (BID) | ORAL | Status: DC

## 2015-02-07 MED ORDER — BENZONATATE 100 MG PO CAPS: 200.0000 mg | ORAL_CAPSULE | Freq: Two times a day (BID) | ORAL | Status: DC | PRN

## 2015-02-07 MED ORDER — HYDROCODONE-HOMATROPINE 5-1.5 MG/5ML PO SYRP: 5.0000 mL | ORAL_SOLUTION | Freq: Every evening | ORAL | Status: DC | PRN

## 2015-02-07 NOTE — Progress Notes (Signed)
Chief Complaint:  Chief Complaint  Patient presents with  . Cough    x 3 days  . Sore Throat    x 3 days    HPI: Diana Hampton is a 65 y.o. female who reports to [facility] today complaining of sore throat and cough x 3 days, has pleuritic CP with cough, she has rhinorrhea, watery eyes,  Ears feel funny, No CP without cough, SOB or wheezing. She has a hx of asthma, she has had to use her inhaler x 1. She has been taking allergy meds without releif.    Past Medical History  Diagnosis Date  . Malignant neoplasm of corpus uteri, except isthmus ([place])   . Allergy   . Hyperlipidemia   . Asthma   . GERD (gastroesophageal reflux disease)   . Sleep apnea     c- pap wears   Past Surgical History  Procedure Laterality Date  . Abdominal hysterectomy  2000  . Oophorectomy  2000  . Colonoscopy    . Upper gastrointestinal endoscopy     Social History   Social History  . Marital Status: Married    Spouse Name: N/A  . Number of Children: 2  . Years of Education: 14   Occupational History  . 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 Smoking status: Never Smoker   . Smokeless tobacco: Never Used  . Alcohol Use: No  . Drug Use: No  . Sexual Activity:    Partners: Male   Other Topics Concern  . 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  Married in 1973 - 13 yrs then divorced; married 1982. 1 Son 76, 1 daughter 1977-deceased '19 OD (she has had counseling); 5 grandchildren (3 biological, 2 step).  43 y/o granddaughter lives with her, 15 y/o every weekend. Marriage in good health.    Family History  Problem Relation Age of Onset  . Hypertension Mother   . Cancer Father 64    Colon  . Colon cancer Father   . Heart disease Brother 14    MI  . Colon cancer Brother 48    2 brothers  . Cancer Brother 84    lung ca  . Diabetes Other     Grandfather  . Esophageal cancer Neg Hx   .  Pancreatic cancer Neg Hx   . Prostate cancer Neg Hx   . Rectal cancer Neg Hx   . Stomach cancer Neg Hx    No Known Allergies Prior to Admission medications   Medication Sig Start Date End Date Taking? Authorizing Provider  atorvastatin (LIPITOR) 20 MG tablet Take 1 tablet (20 mg total) by mouth daily. 06/24/14  Yes Golden Circle, FNP  esomeprazole (NEXIUM) 20 MG capsule Take 20 mg by mouth daily at 12 noon.   Yes Historical Provider, MD  montelukast (SINGULAIR) 10 MG tablet Take 1 tablet (10 mg total) by mouth at bedtime. 06/24/14  Yes Golden Circle, FNP     ROS: The patient denies fevers, chills, night sweats, unintentional weight loss, chest pain, palpitations, wheezing, dyspnea on exertion, nausea, vomiting, abdominal pain, dysuria, hematuria, melena, numbness, weakness, or tingling.  All other systems have been reviewed and were otherwise negative with the exception of those mentioned in the HPI and as above.    PHYSICAL EXAM: Filed Vitals:   02/07/15 0814  BP: 134/80  Pulse: 89  Temp: 97.8  F (36.6 C)  Resp: 16   Body mass index is 24.77 kg/(m^2).   General: Alert, no acute distress HEENT:  Normocephalic, atraumatic, oropharynx patent. EOMI, PERRLA Erythematous throat, no exudates, TM normal, +/- sinus tenderness, + erythematous/boggy nasal mucosa Cardiovascular:  Regular rate and rhythm, no rubs murmurs or gallops.  No Carotid bruits, radial pulse intact. No pedal edema.  Respiratory: Clear to auscultation bilaterally.  No wheezes, rales, or rhonchi.  No cyanosis, no use of accessory musculature Abdominal: No organomegaly, abdomen is soft and non-tender, positive bowel sounds. No masses. Skin: No rashes. Neurologic: Facial musculature symmetric. Psychiatric: Patient acts appropriately throughout our interaction. Lymphatic: No cervical or submandibular lymphadenopathy Musculoskeletal: Gait intact. No edema, tenderness   LABS: Results for orders placed or performed  in visit on 02/07/15  POCT rapid strep A  Result Value Ref Range   Rapid Strep A Screen Negative Negative     EKG/XRAY:   Primary read interpreted by Dr. Marin Comment at [facility].   ASSESSMENT/PLAN: Encounter Diagnoses  Name Primary?  . Acute pharyngitis, unspecified pharyngitis type Yes  . Cough    Acute respiratory infection Sxs treatment: tessalon, hycodan If no improvement then take Amoxacillin Throat culture pending  Gross sideeffects, risk and benefits, and alternatives of medications d/w patient. Patient is aware that all medications have potential sideeffects and we are unable to predict every sideeffect or drug-drug interaction that may occur.  Brennon Otterness DO  02/07/2015 8:40 AM

## 2015-02-07 NOTE — Progress Notes (Deleted)
   Subjective:    Patient ID: Diana Hampton, female    DOB: [birth year], 65 y.o.   MRN: [id number]  Chief Complaint  Patient presents with  . Cough    x 3 days  . Sore Throat    x 3 days    HPI  Hx of GERD and OSA.   Review of Systems     Objective:   Physical Exam        Assessment & Plan:

## 2015-02-10 LAB — CULTURE, GROUP A STREP

## 2015-02-27 ENCOUNTER — Telehealth: Payer: Self-pay | Admitting: Family

## 2015-02-27 MED ORDER — FLUCONAZOLE 150 MG PO TABS
150.0000 mg | ORAL_TABLET | Freq: Once | ORAL | Status: DC
Start: 2015-02-27 — End: 2015-03-02

## 2015-02-27 NOTE — Telephone Encounter (Signed)
Diflucan sent to pharmacy.

## 2015-02-27 NOTE — Telephone Encounter (Signed)
Pt was sick a few weeks ago and was given antibiotics. She has developed a yeast infection that she has been trying to treat herself with Monistat. This is not helping and is getting worse. Can you please send her in something for it to [facility] on [address].  Can you please give her a call at [phone]

## 2015-02-27 NOTE — Telephone Encounter (Signed)
Please advise 

## 2015-02-27 NOTE — Telephone Encounter (Signed)
Pt aware.

## 2015-03-02 ENCOUNTER — Other Ambulatory Visit: Payer: Self-pay

## 2015-03-02 MED ORDER — FLUCONAZOLE 150 MG PO TABS: 150.0000 mg | ORAL_TABLET | Freq: Once | ORAL | Status: DC

## 2015-05-13 ENCOUNTER — Ambulatory Visit (INDEPENDENT_AMBULATORY_CARE_PROVIDER_SITE_OTHER): Payer: BLUE CROSS/BLUE SHIELD | Admitting: Emergency Medicine

## 2015-05-13 ENCOUNTER — Ambulatory Visit (INDEPENDENT_AMBULATORY_CARE_PROVIDER_SITE_OTHER): Payer: BLUE CROSS/BLUE SHIELD

## 2015-05-13 ENCOUNTER — Telehealth: Payer: Self-pay | Admitting: Family

## 2015-05-13 VITALS — BP 130/78 | HR 74 | Temp 98.6°F | Resp 18 | Ht 66.0 in | Wt 155.0 lb

## 2015-05-13 DIAGNOSIS — R938 Abnormal findings on diagnostic imaging of other specified body structures: Secondary | ICD-10-CM

## 2015-05-13 DIAGNOSIS — R9389 Abnormal findings on diagnostic imaging of other specified body structures: Secondary | ICD-10-CM

## 2015-05-13 DIAGNOSIS — R0789 Other chest pain: Secondary | ICD-10-CM | POA: Diagnosis not present

## 2015-05-13 DIAGNOSIS — M546 Pain in thoracic spine: Secondary | ICD-10-CM

## 2015-05-13 NOTE — Patient Instructions (Signed)
     IF you received an x-ray today, you will receive an invoice from [facility]. Please contact [facility] at [phone] with questions or concerns regarding your invoice.   IF you received labwork today, you will receive an invoice from [facility]. Please contact [facility] at [phone] with questions or concerns regarding your invoice.   Our billing staff will not be able to assist you with questions regarding bills from these companies.  You will be contacted with the lab results as soon as they are available. The fastest way to get your results is to activate your My Chart account. Instructions are located on the last page of this paperwork. If you have not heard from us regarding the results in 2 weeks, please contact this office.      

## 2015-05-13 NOTE — Progress Notes (Signed)
Subjective:    Patient ID: Diana Hampton, female    DOB: April 25, 1950, 65 y.o.   MRN: [id number]  HPI This is a pleasant 65 yo female who presents today with 1 month of mid, right sided back pain. It has been coming and going but got worse last night. Pain is sharp and feels like a "catch." No arm pain or neck pain. Does not recall any trauma, heavy lifting or repetitive motions. She denies falls. No recent URI. Has taken Alleve 1 tablet with some relief. Had difficulty sleeping last night. No rash. No SOB or wheeze, but pain with deep breathing and coughing. No fevers. No numbness or tingling in arms or legs, no weakness. No bowel or bladder incontinence.   Past Medical History  Diagnosis Date  . Malignant neoplasm of corpus uteri, except isthmus ([place])   . Allergy   . Hyperlipidemia   . Asthma   . GERD (gastroesophageal reflux disease)   . Sleep apnea     c- pap wears   Past Surgical History  Procedure Laterality Date  . Abdominal hysterectomy  2000  . Oophorectomy  2000  . Colonoscopy    . Upper gastrointestinal endoscopy     Family History  Problem Relation Age of Onset  . Hypertension Mother   . Cancer Father 74    Colon  . Colon cancer Father   . Heart disease Brother 64    MI  . Colon cancer Brother 88    2 brothers  . Cancer Brother 40    lung ca  . Diabetes Other     Grandfather  . Esophageal cancer Neg Hx   . Pancreatic cancer Neg Hx   . Prostate cancer Neg Hx   . Rectal cancer Neg Hx   . Stomach cancer Neg Hx    Social History  Substance Use Topics  . Smoking status: Never Smoker   . Smokeless tobacco: Never Used  . Alcohol Use: No   Review of Systems Per HPI    Objective:   Physical Exam  Constitutional: She is oriented to person, place, and time. She appears well-developed and well-nourished.  HENT:  Head: Normocephalic and atraumatic.  Right Ear: External ear normal.  Left Ear: External ear normal.  Eyes: Conjunctivae are normal.  Neck:  Normal range of motion. Neck supple.  Cardiovascular: Normal rate, regular rhythm and normal heart sounds.   Pulmonary/Chest: Effort normal and breath sounds normal. No respiratory distress. She has no wheezes. She has no rales. She exhibits no tenderness.  Musculoskeletal: Normal range of motion.       Cervical back: Normal.       Thoracic back: Normal.       Lumbar back: Normal.  Unable to reproduce pain with palpation of spin, ribs, scapula.   Neurological: She is alert and oriented to person, place, and time.  Skin: Skin is warm and dry.  Psychiatric: She has a normal mood and affect. Her behavior is normal. Judgment and thought content normal.      BP 130/78 mmHg  Pulse 74  Temp(Src) 98.6 F (37 C) (Oral)  Resp 18  Ht 5\' 6"  (1.676 m)  Wt 155 lb (70.308 kg)  BMI 25.03 kg/m2  SpO2 97% Wt Readings from Last 3 Encounters:  05/13/15 155 lb (70.308 kg)  02/07/15 153 lb 6.4 oz (69.582 kg)  06/24/14 155 lb 1.9 oz (70.362 kg)  Dg Chest 2 View  05/13/2015  CLINICAL DATA:  Pain. EXAM: CHEST  2 VIEW COMPARISON:  No recent prior. FINDINGS: Mediastinum and hilar structures normal. Heart size normal. Mild left base subsegmental atelectasis and or scarring. No pneumothorax. Minimally displaced right anterior lateral fifth rib fracture cannot be excluded. Associated adjacent mild pleural thickening noted. IMPRESSION: 1.  Mild left base subsegmental atelectasis and/or scarring. 2. Minimally displaced right anterior lateral fifth rib fracture not be excluded. Associated mild pleural thickening in this region noted. Electronically Signed   By: Marcello Moores  Register   On: 05/13/2015 14:12      Assessment & Plan:  Reviewed with Dr. Everlene Farrier  1. Right-sided thoracic back pain - DG Chest 2 View; Future - CT Chest Wo Contrast; Future - suggested she increase Alleve to 2 BID  2. Right-sided chest wall pain - DG Chest 2 View; Future - CT Chest Wo Contrast; Future  3. Abnormal CXR - CT Chest Wo Contrast;  Future  - she already has appointment with her PCP tomorrow which she will keep  Clarene Reamer, FNP-BC  [facility]  05/13/2015 4:04 PM

## 2015-05-13 NOTE — Telephone Encounter (Signed)
Please advise 

## 2015-05-13 NOTE — Telephone Encounter (Signed)
PLEASE NOTE: All timestamps contained within this report are represented as [place] Standard Time. CONFIDENTIALTY NOTICE: This fax transmission is intended only for the addressee. It contains information that is legally privileged, confidential or otherwise protected from use or disclosure. If you are not the intended recipient, you are strictly prohibited from reviewing, disclosing, copying using or disseminating any of this information or taking any action in reliance on or regarding this information. If you have received this fax in error, please notify us immediately by telephone so that we can arrange for its return to [place]. Phone: [phone], Toll-Free: [phone], Fax: [phone] Page: 1 of 1 Call Id: [id number] [facility] Client [place] Patient Name: Diana Hampton DOB: May 18, 1950 Initial Comment Caller states she has an appt at 9:45 tomorrow- She has done something to her back- it is on and off pain. Nurse Assessment Nurse: Dimas Chyle, RN, Dellis Filbert Date/Time Eilene Ghazi Time): 05/13/2015 10:52:26 AM Confirm and document reason for call. If symptomatic, describe symptoms. You must click the next button to save text entered. ---Caller states she has an appt at 9:45 tomorrow- She has done something to her back- it is on and off pain. Back pain for a month. Has gotten worse this week. Pain in right upper back in shoulder. Has the patient traveled out of the country within the last 30 days? ---No Does the patient have any new or worsening symptoms? ---Yes Will a triage be completed? ---Yes Related visit to physician within the last 2 weeks? ---No Does the PT have any chronic conditions? (i.e. diabetes, asthma, etc.) ---No Is this a behavioral health or substance abuse call? ---No Guidelines Guideline Title Affirmed Question Affirmed Notes Back Pain [1] SEVERE back pain (e.g., excruciating, unable to do any normal  activities) AND [2] not improved 2 hours after pain medicine Final Disposition User See Physician within 4 Hours (or PCP triage) Dimas Chyle, RN, Dellis Filbert Comments Attempted to schedule appointment and according to schedule none available for today. Advised caller to be seen in [place]. Referrals GO TO FACILITY UNDECIDED Disagree/Comply: Comply

## 2015-05-13 NOTE — Telephone Encounter (Signed)
Noted - agree with UC or follow up tomorrow.

## 2015-05-14 ENCOUNTER — Other Ambulatory Visit: Payer: Self-pay | Admitting: Emergency Medicine

## 2015-05-14 ENCOUNTER — Encounter: Payer: Self-pay | Admitting: Family

## 2015-05-14 ENCOUNTER — Ambulatory Visit (INDEPENDENT_AMBULATORY_CARE_PROVIDER_SITE_OTHER): Payer: BLUE CROSS/BLUE SHIELD | Admitting: Family

## 2015-05-14 VITALS — BP 132/82 | HR 62 | Temp 97.7°F | Resp 14 | Ht 66.0 in | Wt 154.8 lb

## 2015-05-14 DIAGNOSIS — R079 Chest pain, unspecified: Secondary | ICD-10-CM | POA: Diagnosis not present

## 2015-05-14 DIAGNOSIS — E785 Hyperlipidemia, unspecified: Secondary | ICD-10-CM

## 2015-05-14 DIAGNOSIS — J302 Other seasonal allergic rhinitis: Secondary | ICD-10-CM

## 2015-05-14 DIAGNOSIS — R0789 Other chest pain: Secondary | ICD-10-CM

## 2015-05-14 DIAGNOSIS — M546 Pain in thoracic spine: Secondary | ICD-10-CM

## 2015-05-14 MED ORDER — ATORVASTATIN CALCIUM 20 MG PO TABS: 20.0000 mg | ORAL_TABLET | Freq: Every day | ORAL | Status: DC

## 2015-05-14 MED ORDER — ACETAMINOPHEN-CODEINE #3 300-30 MG PO TABS
1.0000 | ORAL_TABLET | ORAL | Status: DC | PRN
Start: 2015-05-14 — End: 2017-04-27

## 2015-05-14 MED ORDER — MONTELUKAST SODIUM 10 MG PO TABS: 10.0000 mg | ORAL_TABLET | Freq: Every day | ORAL | Status: DC

## 2015-05-14 NOTE — Assessment & Plan Note (Signed)
Seasonal allergies appears stable and well maintained with montelukast with no adverse symptoms noted. Continue current dosage of montelukast.

## 2015-05-14 NOTE — Progress Notes (Signed)
Pre visit review using our clinic review tool, if applicable. No additional management support is needed unless otherwise documented below in the visit note. 

## 2015-05-14 NOTE — Assessment & Plan Note (Signed)
Right-sided chest pain and thoracic pain concerning for pleurisy with abnormal chest x-ray noted. Obtain stat CT scan to rule out underlying infections, pleurisy, or other underlying pathology. Unlikely sarcoidosis. Start Tylenol 3 as needed for discomfort. If symptoms worsen advised to go to the emergency room. Follow-up pending CT results.

## 2015-05-14 NOTE — Progress Notes (Signed)
Subjective:    Patient ID: Diana Hampton, female    DOB: April 25, 1950, 65 y.o.   MRN: [id number]  Chief Complaint  Patient presents with  . Back Pain    was seen yesterday at [facility] for back pain bc she could not get worked in today, they did an Insurance account manager and told her to come here to get a CT scan set up, refills for atorvastatin and singulair    HPI:  Diana Hampton is a 65 y.o. female who  has a past medical history of Malignant neoplasm of corpus uteri, except isthmus ([place]); Allergy; Hyperlipidemia; Asthma; GERD (gastroesophageal reflux disease); and Sleep apnea. and presents today For an office follow-up.  1.) Hyperlipidemia - currently maintained on atorvastatin. Reports taking the medication as prescribed and denies adverse side effects or myalgias.  Lab Results  Component Value Date   CHOL 203* 06/24/2014   HDL 53.90 06/24/2014   LDLCALC 112* 06/24/2014   LDLDIRECT 130.7 12/18/2012   TRIG 187.0* 06/24/2014   CHOLHDL 4 06/24/2014    2.) Seasonal allergies - currently maintained on montelukast test. Reports taking the medication as prescribed and denies adverse side effects. Notes she symptoms radically controlled with current medication regimen.  3.) Back pain - recently evaluated in urgent care for 1 month of right-sided back pain that waxes and wanes and described as a sharp feeling with a "catch". On physical exam unable to reproduce pain with palpation spine, ribs, or scapula. X-rays showed mild left base subsegmental atelectasis and/or's guarding; and minimally displaced right anterior lateral fifth rib fracture not to be excluded with mild pleural thickening in the region. She was scheduled for a CT scan of the chest without contrast advised to increase her Aleve to 2 twice daily. Continues to experience the associated symptom of pain located from her back to her chest. Pain is constant. Severity on occasion is enough to effect her breathing. Denies any trauma that she recall.  Has been going on for about 1 month.    No Known Allergies   Outpatient Prescriptions Prior to Visit  Medication Sig Dispense Refill  . esomeprazole (NEXIUM) 20 MG capsule Take 20 mg by mouth daily at 12 noon.    Marland Kitchen atorvastatin (LIPITOR) 20 MG tablet Take 1 tablet (20 mg total) by mouth daily. 90 tablet 1  . montelukast (SINGULAIR) 10 MG tablet Take 1 tablet (10 mg total) by mouth at bedtime. 90 tablet 1   No facility-administered medications prior to visit.    Past Surgical History  Procedure Laterality Date  . Abdominal hysterectomy  2000  . Oophorectomy  2000  . Colonoscopy    . Upper gastrointestinal endoscopy      Past Medical History  Diagnosis Date  . Malignant neoplasm of corpus uteri, except isthmus ([place])   . Allergy   . Hyperlipidemia   . Asthma   . GERD (gastroesophageal reflux disease)   . Sleep apnea     c- pap wears     Review of Systems  Constitutional: Negative for fever and chills.  Respiratory: Positive for chest tightness. Negative for shortness of breath.   Cardiovascular: Positive for chest pain. Negative for palpitations and leg swelling.  Musculoskeletal: Positive for back pain (Thoracic).      Objective:    BP 132/82 mmHg  Pulse 62  Temp(Src) 97.7 F (36.5 C) (Oral)  Resp 14  Ht 5\' 6"  (1.676 m)  Wt 154 lb 12.8 oz (70.217 kg)  BMI 25.00 kg/m2  SpO2 95% Nursing note and vital signs reviewed.  Physical Exam  Constitutional: She is oriented to person, place, and time. She appears well-developed and well-nourished. No distress.  Cardiovascular: Normal rate, regular rhythm, normal heart sounds and intact distal pulses.   Pulmonary/Chest: Effort normal and breath sounds normal.  Musculoskeletal:  Right chest / thoracic spine - No obvious deformity, discoloration or edema noted. No palpable tenderness able to be elicited or unable to reproduce symptoms. Does note with deep breathing does have pain. Movement does not effect it. No neck pain.  Pulses are intact and appropriate.   Neurological: She is alert and oriented to person, place, and time.  Skin: Skin is warm and dry.  Psychiatric: She has a normal mood and affect. Her behavior is normal. Judgment and thought content normal.       Assessment & Plan:   Problem List Items Addressed This Visit      Respiratory   Seasonal allergies    Seasonal allergies appears stable and well maintained with montelukast with no adverse symptoms noted. Continue current dosage of montelukast.      Relevant Medications   montelukast (SINGULAIR) 10 MG tablet     Other   Hyperlipidemia - Primary    Appears stable with current regimen of atorvastatin with no adverse side effects or myalgias. Obtain lipid profile. Continue current dosage of atorvastatin pending lipid profile results.      Relevant Medications   atorvastatin (LIPITOR) 20 MG tablet   Other Relevant Orders   Lipid Profile   Right-sided chest pain   Relevant Medications   acetaminophen-codeine (TYLENOL #3) 300-30 MG tablet   Thoracic back pain    Right-sided chest pain and thoracic pain concerning for pleurisy with abnormal chest x-ray noted. Obtain stat CT scan to rule out underlying infections, pleurisy, or other underlying pathology. Unlikely sarcoidosis. Start Tylenol 3 as needed for discomfort. If symptoms worsen advised to go to the emergency room. Follow-up pending CT results.      Relevant Medications   acetaminophen-codeine (TYLENOL #3) 300-30 MG tablet       I am having [name] start on acetaminophen-codeine. I am also having her maintain her esomeprazole, atorvastatin, and montelukast.   Meds ordered this encounter  Medications  . atorvastatin (LIPITOR) 20 MG tablet    Sig: Take 1 tablet (20 mg total) by mouth daily.    Dispense:  90 tablet    Refill:  1    Order Specific Question:  Supervising Provider    Answer:  Pricilla Holm A [id number]  . montelukast (SINGULAIR) 10 MG tablet    Sig: Take 1  tablet (10 mg total) by mouth at bedtime.    Dispense:  90 tablet    Refill:  1    Order Specific Question:  Supervising Provider    Answer:  Pricilla Holm A [id number]  . acetaminophen-codeine (TYLENOL #3) 300-30 MG tablet    Sig: Take 1-2 tablets by mouth every 4 (four) hours as needed for moderate pain.    Dispense:  60 tablet    Refill:  0    Order Specific Question:  Supervising Provider    Answer:  Pricilla Holm A [id number]     Follow-up: Pending CT results.  Mauricio Po, FNP

## 2015-05-14 NOTE — Assessment & Plan Note (Signed)
Appears stable with current regimen of atorvastatin with no adverse side effects or myalgias. Obtain lipid profile. Continue current dosage of atorvastatin pending lipid profile results.

## 2015-05-14 NOTE — Patient Instructions (Addendum)
Thank you for choosing [facility].  Summary/Instructions:   They will call to schedule the CT scan.   Your prescription(s) have been submitted to your pharmacy or been printed and provided for you. Please take as directed and contact our office if you believe you are having problem(s) with the medication(s) or have any questions.  If your symptoms worsen or fail to improve, please contact our office for further instruction, or in case of emergency go directly to the emergency room at the closest medical facility.   Pleurisy Pleurisy is an inflammation and swelling of the lining of the lungs (pleura). Because of this inflammation, it hurts to breathe. It can be aggravated by coughing, laughing, or deep breathing. Pleurisy is often caused by an underlying infection or disease.  HOME CARE INSTRUCTIONS  Monitor your pleurisy for any changes. The following actions may help to alleviate any discomfort you are experiencing:  Medicine may help with pain. Only take over-the-counter or prescription medicines for pain, discomfort, or fever as directed by your health care provider.  Only take antibiotic medicine as directed. Make sure to finish it even if you start to feel better. SEEK MEDICAL CARE IF:   Your pain is not controlled with medicine or is increasing.  You have an increase in pus-like (purulent) secretions brought up with coughing. SEEK IMMEDIATE MEDICAL CARE IF:   You have blue or dark lips, fingernails, or toenails.  You are coughing up blood.  You have increased difficulty breathing.  You have continuing pain unrelieved by medicine or pain lasting more than 1 week.  You have pain that radiates into your neck, arms, or jaw.  You develop increased shortness of breath or wheezing.  You develop a fever, rash, vomiting, fainting, or other serious symptoms. MAKE SURE YOU:  Understand these instructions.   Will watch your condition.   Will get help right away if you  are not doing well or get worse.    This information is not intended to replace advice given to you by your health care provider. Make sure you discuss any questions you have with your health care provider.   Document Released: 12/27/2004 Document Revised: 08/29/2012 Document Reviewed: 06/10/2012 Elsevier Interactive Patient Education [employer].

## 2015-05-18 ENCOUNTER — Telehealth: Payer: Self-pay

## 2015-05-18 ENCOUNTER — Ambulatory Visit
Admission: RE | Admit: 2015-05-18 | Discharge: 2015-05-18 | Disposition: A | Discharge status: Home / Self Care | Payer: BLUE CROSS/BLUE SHIELD | Source: Ambulatory Visit | Attending: Emergency Medicine | Admitting: Emergency Medicine

## 2015-05-18 DIAGNOSIS — R0789 Other chest pain: Secondary | ICD-10-CM

## 2015-05-18 NOTE — Telephone Encounter (Signed)
Pt is wanting a call on her mobile number [phone] to get the results of her ct scan from today by dr Everlene Farrier

## 2015-05-18 NOTE — Telephone Encounter (Signed)
I call patient with results

## 2015-05-20 ENCOUNTER — Other Ambulatory Visit: Payer: Self-pay | Admitting: Internal Medicine

## 2016-02-19 DIAGNOSIS — R002 Palpitations: Secondary | ICD-10-CM | POA: Diagnosis not present

## 2016-02-19 DIAGNOSIS — Z79899 Other long term (current) drug therapy: Secondary | ICD-10-CM | POA: Diagnosis not present

## 2016-02-19 DIAGNOSIS — Z23 Encounter for immunization: Secondary | ICD-10-CM | POA: Diagnosis not present

## 2016-02-19 DIAGNOSIS — E7800 Pure hypercholesterolemia, unspecified: Secondary | ICD-10-CM | POA: Diagnosis not present

## 2016-02-19 DIAGNOSIS — G4733 Obstructive sleep apnea (adult) (pediatric): Secondary | ICD-10-CM | POA: Diagnosis not present

## 2016-02-19 DIAGNOSIS — Z0000 Encounter for general adult medical examination without abnormal findings: Secondary | ICD-10-CM | POA: Diagnosis not present

## 2016-03-26 DIAGNOSIS — Z1231 Encounter for screening mammogram for malignant neoplasm of breast: Secondary | ICD-10-CM | POA: Diagnosis not present

## 2016-05-03 DIAGNOSIS — Z0101 Encounter for examination of eyes and vision with abnormal findings: Secondary | ICD-10-CM | POA: Diagnosis not present

## 2016-06-16 DIAGNOSIS — L2082 Flexural eczema: Secondary | ICD-10-CM | POA: Diagnosis not present

## 2016-08-23 DIAGNOSIS — Z01419 Encounter for gynecological examination (general) (routine) without abnormal findings: Secondary | ICD-10-CM | POA: Diagnosis not present

## 2016-08-23 DIAGNOSIS — N952 Postmenopausal atrophic vaginitis: Secondary | ICD-10-CM | POA: Diagnosis not present

## 2016-10-06 DIAGNOSIS — Z23 Encounter for immunization: Secondary | ICD-10-CM | POA: Diagnosis not present

## 2016-10-06 DIAGNOSIS — L821 Other seborrheic keratosis: Secondary | ICD-10-CM | POA: Diagnosis not present

## 2016-10-06 DIAGNOSIS — L309 Dermatitis, unspecified: Secondary | ICD-10-CM | POA: Diagnosis not present

## 2016-10-06 DIAGNOSIS — D225 Melanocytic nevi of trunk: Secondary | ICD-10-CM | POA: Diagnosis not present

## 2016-10-06 DIAGNOSIS — D235 Other benign neoplasm of skin of trunk: Secondary | ICD-10-CM | POA: Diagnosis not present

## 2017-01-31 DIAGNOSIS — M8589 Other specified disorders of bone density and structure, multiple sites: Secondary | ICD-10-CM | POA: Diagnosis not present

## 2017-02-24 DIAGNOSIS — Z23 Encounter for immunization: Secondary | ICD-10-CM | POA: Diagnosis not present

## 2017-02-24 DIAGNOSIS — Z0000 Encounter for general adult medical examination without abnormal findings: Secondary | ICD-10-CM | POA: Diagnosis not present

## 2017-02-24 DIAGNOSIS — Z1389 Encounter for screening for other disorder: Secondary | ICD-10-CM | POA: Diagnosis not present

## 2017-02-24 DIAGNOSIS — E7800 Pure hypercholesterolemia, unspecified: Secondary | ICD-10-CM | POA: Diagnosis not present

## 2017-02-24 DIAGNOSIS — G4733 Obstructive sleep apnea (adult) (pediatric): Secondary | ICD-10-CM | POA: Diagnosis not present

## 2017-02-24 DIAGNOSIS — K219 Gastro-esophageal reflux disease without esophagitis: Secondary | ICD-10-CM | POA: Diagnosis not present

## 2017-02-24 DIAGNOSIS — Z79899 Other long term (current) drug therapy: Secondary | ICD-10-CM | POA: Diagnosis not present

## 2017-03-17 IMAGING — CT CT CHEST W/O CM
3 of 4 series · 17 of 30 positions shown, 19 images · non-contrast
Comparison: Chest x-ray dated 05/13/2015

CLINICAL DATA: Chest pain and mid back pain with cough.

EXAM:
CT CHEST WITHOUT CONTRAST
TECHNIQUE: Multidetector CT imaging of the chest was performed following the
standard protocol without IV contrast.

[Series 3: chest w/o · axial · non-contrast · 0.59mm/px · z∈[-260,-20]mm · 8 of 124 slices shown, 10 images]
[im 14/124  mediastinal]
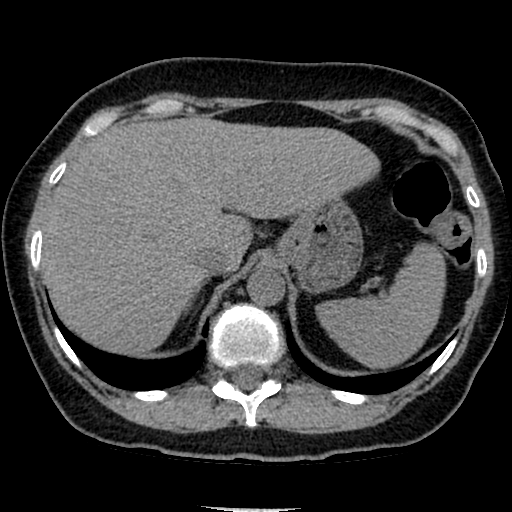
[im 14/124  lung]
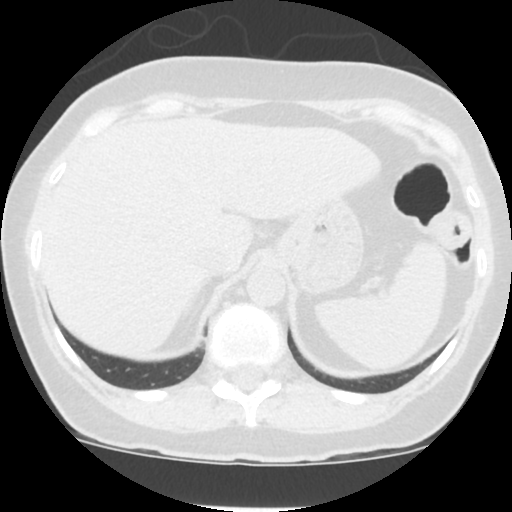
[im 28/124  lung]
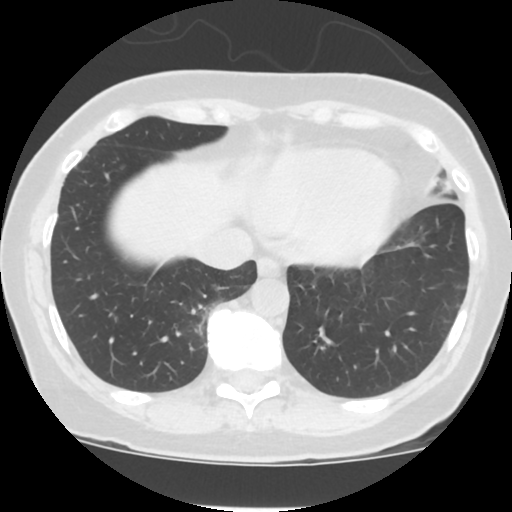
[im 42/124  lung]
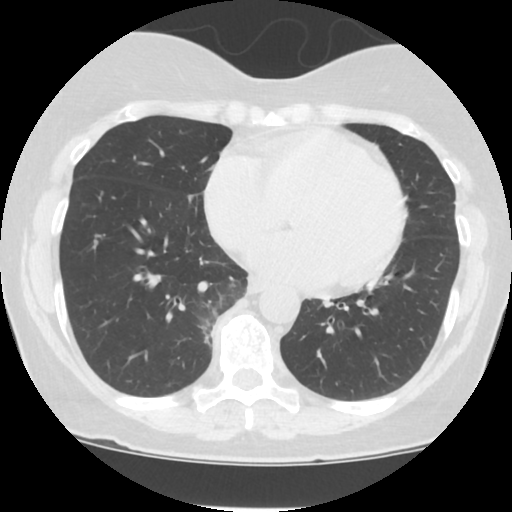
[im 55/124  lung]
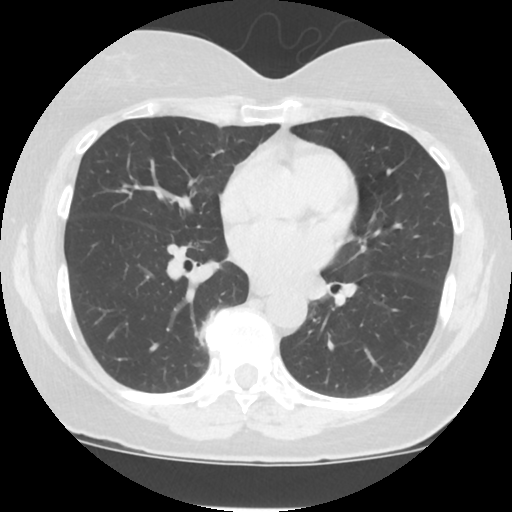
[im 69/124  mediastinal]
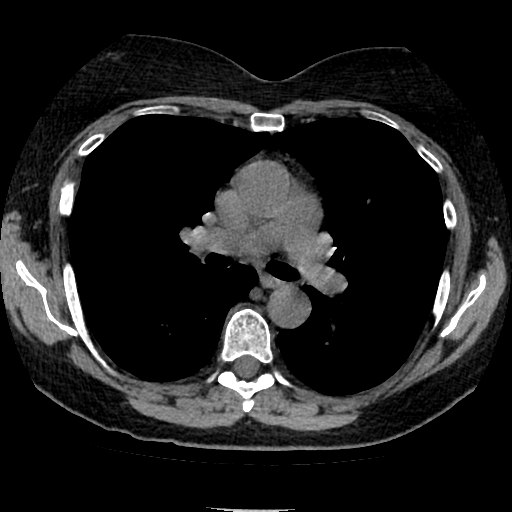
[im 69/124  lung]
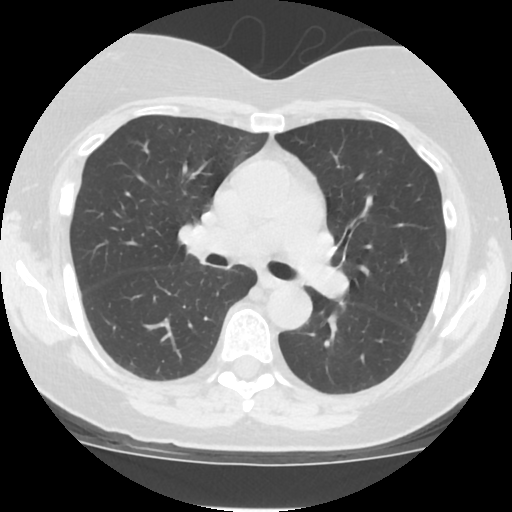
[im 83/124  lung]
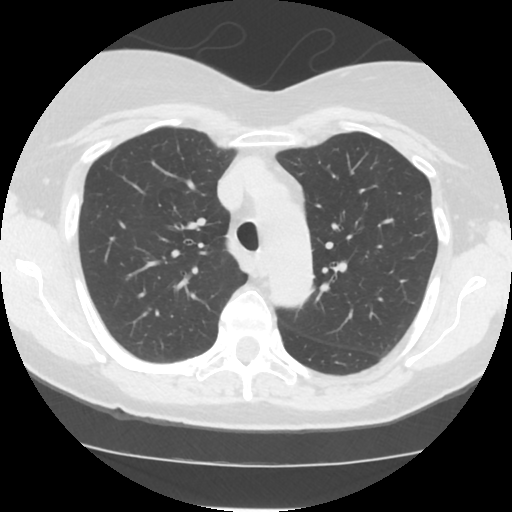
[im 96/124  lung]
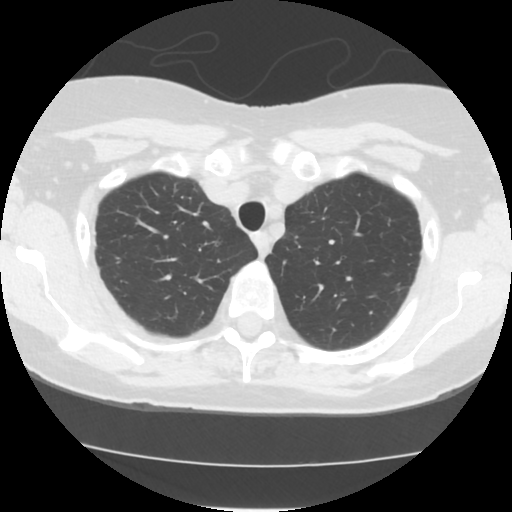
[im 110/124  lung]
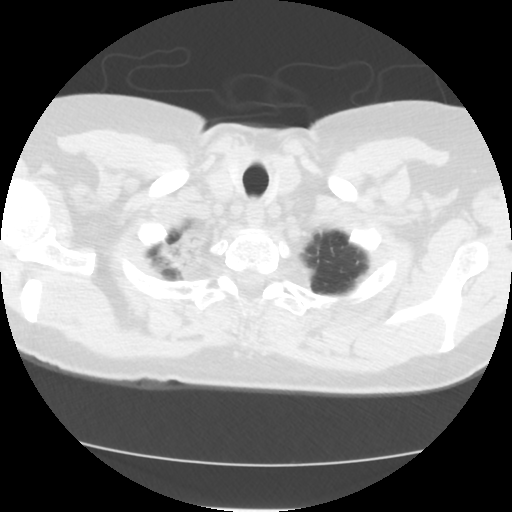

[Series 4: lung windows · axial · 0.59mm/px · z∈[-256,-26]mm · 7 of 124 slices shown]
[im 16/124  lung]
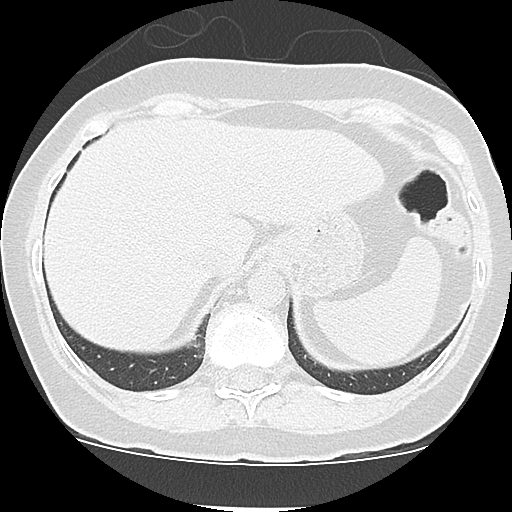
[im 31/124  lung]
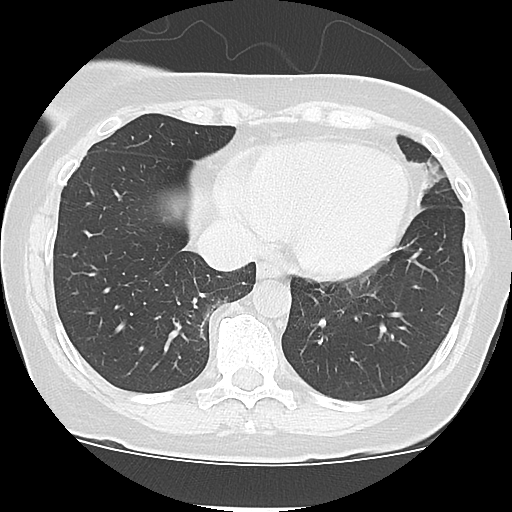
[im 47/124  lung]
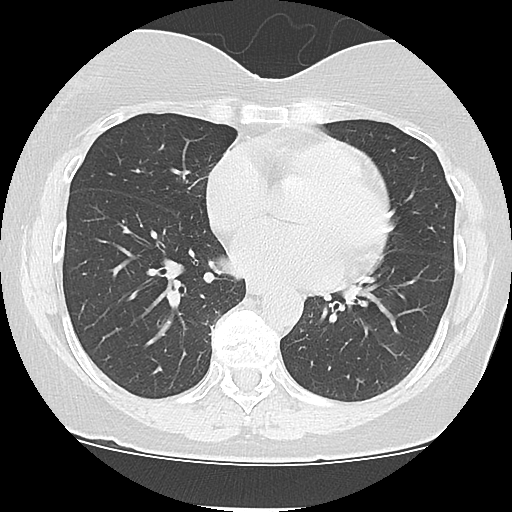
[im 62/124  lung]
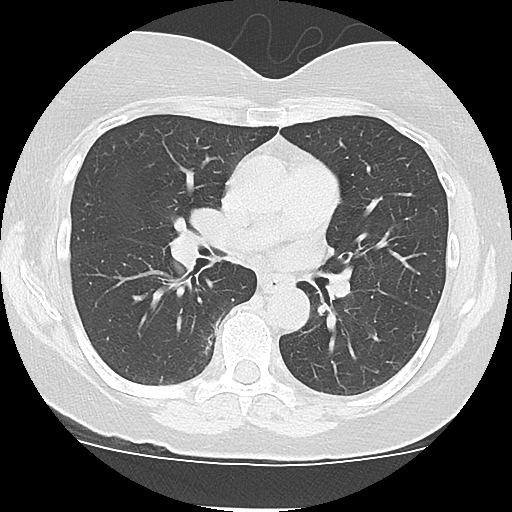
[im 77/124  lung]
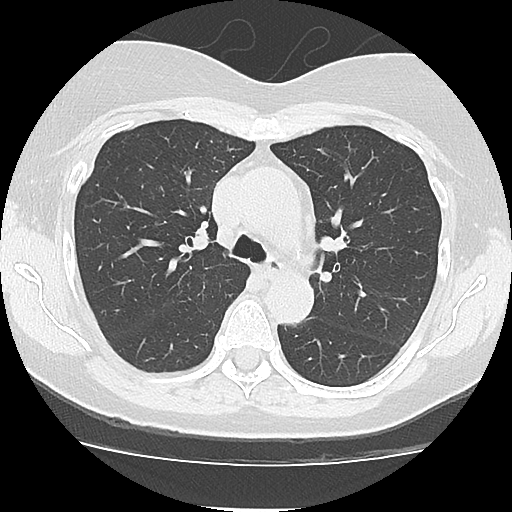
[im 93/124  lung]
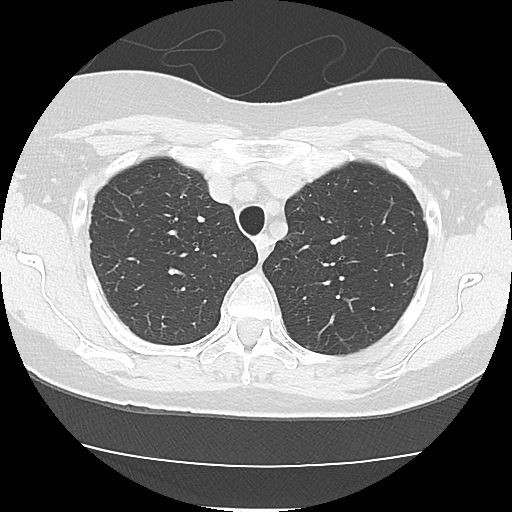
[im 108/124  lung]
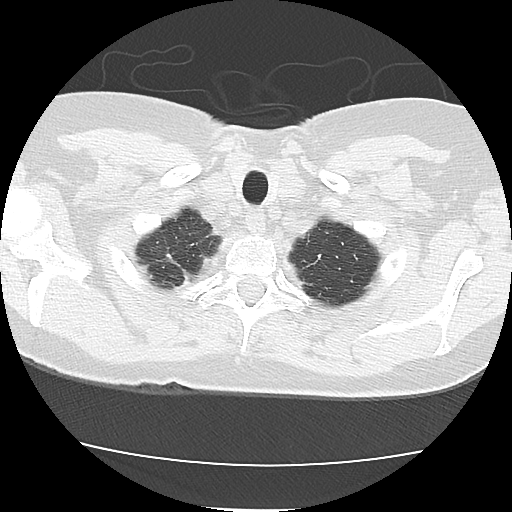

[Series 602: sagittal body · sagittal · 0.60mm/px · 2 of 122 slices shown]
[im 16/122  mediastinal]
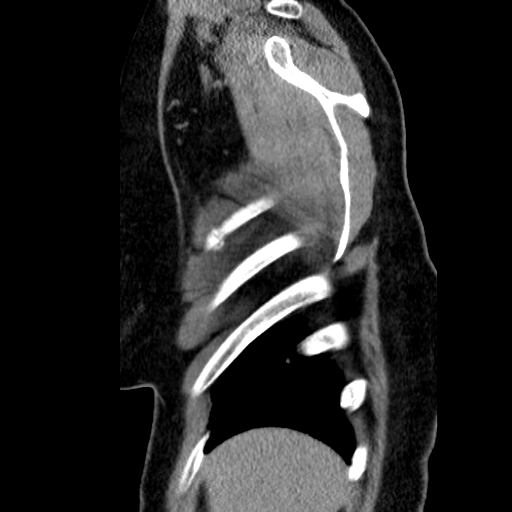
[im 31/122  mediastinal]
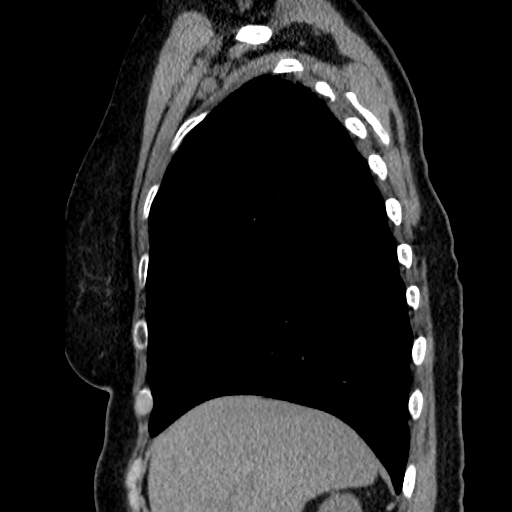

[17 of 30 positions shown; findings below may reference images not displayed]

FINDINGS: Mediastinum/Lymph Nodes: No masses or pathologically enlarged lymph
nodes identified on this un-enhanced exam. Heart size is normal.
Minimal calcification in the aortic arch.

Lungs/Pleura: There is a 5 x 3 mm (average diameter 4 mm) nodule in
the left lower lobe best seen on images 95 and 96 of series 4.
Minimal scarring or inflammation at the inferior lateral aspect of
the lingula. Slight scarring at the right lung apex. The lungs are
otherwise clear. No effusions.

Upper abdomen: Normal.

Musculoskeletal: There is a healing fracture of the lateral aspect
of the right fifth rib and there is a slightly displaced acute
fracture of the anterior lateral aspect of the right fourth rib.
IMPRESSION: 1. Acute fracture of the anterior lateral aspect of the right fourth
rib with slight displacement.
2. Healing fracture of the anterior lateral aspect of the right
fifth rib.
3. 4 mm nodule in the left lower lobe. No follow-up recommended.
This recommendation follows the consensus statement: Guidelines for
Management of Incidental Pulmonary Nodules Detected on CT
Images:From the [HOSPITAL] 3204; published online before
print (10.1148/[id number]).

## 2017-03-31 DIAGNOSIS — Z1231 Encounter for screening mammogram for malignant neoplasm of breast: Secondary | ICD-10-CM | POA: Diagnosis not present

## 2017-04-13 DIAGNOSIS — G4733 Obstructive sleep apnea (adult) (pediatric): Secondary | ICD-10-CM | POA: Diagnosis not present

## 2017-04-27 ENCOUNTER — Encounter: Payer: Self-pay | Admitting: Urgent Care

## 2017-04-27 ENCOUNTER — Other Ambulatory Visit: Payer: Self-pay

## 2017-04-27 ENCOUNTER — Ambulatory Visit (INDEPENDENT_AMBULATORY_CARE_PROVIDER_SITE_OTHER): Payer: Medicare HMO | Admitting: Urgent Care

## 2017-04-27 VITALS — BP 126/74 | HR 69 | Temp 97.9°F | Resp 18 | Ht 66.0 in | Wt 146.4 lb

## 2017-04-27 DIAGNOSIS — J4530 Mild persistent asthma, uncomplicated: Secondary | ICD-10-CM | POA: Diagnosis not present

## 2017-04-27 DIAGNOSIS — R05 Cough: Secondary | ICD-10-CM | POA: Diagnosis not present

## 2017-04-27 DIAGNOSIS — R059 Cough, unspecified: Secondary | ICD-10-CM

## 2017-04-27 DIAGNOSIS — Z9109 Other allergy status, other than to drugs and biological substances: Secondary | ICD-10-CM

## 2017-04-27 DIAGNOSIS — J189 Pneumonia, unspecified organism: Secondary | ICD-10-CM

## 2017-04-27 DIAGNOSIS — R0789 Other chest pain: Secondary | ICD-10-CM

## 2017-04-27 MED ORDER — DOXYCYCLINE HYCLATE 100 MG PO CAPS: 100.0000 mg | ORAL_CAPSULE | Freq: Two times a day (BID) | ORAL | 0 refills | Status: DC

## 2017-04-27 MED ORDER — PREDNISONE 20 MG PO TABS: ORAL_TABLET | ORAL | 0 refills | Status: DC

## 2017-04-27 MED ORDER — HYDROCODONE-HOMATROPINE 5-1.5 MG/5ML PO SYRP: 5.0000 mL | ORAL_SOLUTION | Freq: Every evening | ORAL | 0 refills | Status: DC | PRN

## 2017-04-27 MED ORDER — BENZONATATE 100 MG PO CAPS: 100.0000 mg | ORAL_CAPSULE | Freq: Three times a day (TID) | ORAL | 0 refills | Status: DC | PRN

## 2017-04-27 MED ORDER — ALBUTEROL SULFATE HFA 108 (90 BASE) MCG/ACT IN AERS: 2.0000 | INHALATION_SPRAY | Freq: Four times a day (QID) | RESPIRATORY_TRACT | 1 refills | Status: AC | PRN

## 2017-04-27 NOTE — Progress Notes (Signed)
    MRN: [id number] DOB: [birth year]  Subjective:   Diana Hampton is a 67 y.o. female presenting for 3-week history of productive cough, chest congestion, chest pain worse when she lays down.  She has also had subjective fever and is now having bilateral ear pain and sinus congestion.  She has been using multiple over-the-counter medications including Robitussin, Mucinex, NyQuil, Tylenol with cold and flu.  She is maintaining her allergy medicines including Allegra, Flonase and Sudafed.  Denies sinus pain, nausea, vomiting, belly pain, rashes, dizziness, history of heart failure, history of hypertension.  Patient does have a history of asthma but is out of her inhaler.  Diana Hampton has a current medication list which includes the following prescription(s): atorvastatin and esomeprazole. Also has No Known Allergies.  Diana Hampton  has a past medical history of Allergy, Asthma, GERD (gastroesophageal reflux disease), Hyperlipidemia, Malignant neoplasm of corpus uteri, except isthmus ([place]), and Sleep apnea. Also  has a past surgical history that includes Abdominal hysterectomy (2000); Oophorectomy (2000); Colonoscopy; and Upper gastrointestinal endoscopy.  Objective:   Vitals: BP 126/74 (BP Location: Left Arm, Patient Position: Sitting, Cuff Size: Normal)   Pulse 69   Temp 97.9 F (36.6 C) (Oral)   Resp 18   Ht 5\' 6"  (1.676 m)   Wt 146 lb 6.4 oz (66.4 kg)   SpO2 95%   BMI 23.63 kg/m   Physical Exam  Constitutional: She is oriented to person, place, and time. She appears well-developed and well-nourished.  HENT:  TMs opaque bilaterally but intact and without erythema.  No maxillary sinus tenderness.  Throat with significant postnasal drainage.  Eyes: No scleral icterus.  Neck: Normal range of motion. Neck supple.  Cardiovascular: Normal rate, regular rhythm and intact distal pulses. Exam reveals no gallop and no friction rub.  No murmur heard. Pulmonary/Chest: No respiratory distress. She has  wheezes (mild throughout). She has rales (right mid-lower lung fields).  Lymphadenopathy:    She has no cervical adenopathy.  Neurological: She is alert and oriented to person, place, and time.  Skin: Skin is warm and dry.  Psychiatric: She has a normal mood and affect.    Assessment and Plan :   Community acquired pneumonia of right lung, unspecified part of lung  Cough  Atypical chest pain  Mild persistent extrinsic asthma without complication  Environmental allergies  We will manage patient aggressively for community-acquired pneumonia with doxycycline, short steroid course.  Refill patient's albuterol inhaler and advised that she schedule this.  Cough suppression medications also offered including Hycodan cough syrup and Tessalon capsules.  Patient is to maintain her allergy medications.  Recommended rest and adequate hydration.  Reviewed potential for side effects with medications prescribed today, patient is to return to clinic in 1 week if there is no improvement or sooner if she has worsening symptoms.    Jaynee Eagles, PA-C [facility] [phone] 04/27/2017  12:44 PM

## 2017-04-27 NOTE — Patient Instructions (Addendum)
Community-Acquired Pneumonia, Adult Pneumonia is an infection of the lungs. There are different types of pneumonia. One type can develop while a person is in a hospital. A different type, called community-acquired pneumonia, develops in people who are not, or have not recently been, in the hospital or other health care facility. What are the causes? Pneumonia may be caused by bacteria, viruses, or funguses. Community-acquired pneumonia is often caused by Streptococcus pneumonia bacteria. These bacteria are often passed from one person to another by breathing in droplets from the cough or sneeze of an infected person. What increases the risk? The condition is more likely to develop in:  People who havechronic diseases, such as chronic obstructive pulmonary disease (COPD), asthma, congestive heart failure, cystic fibrosis, diabetes, or kidney disease.  People who haveearly-stage or late-stage HIV.  People who havesickle cell disease.  People who havehad their spleen removed (splenectomy).  People who havepoor dental hygiene.  People who havemedical conditions that increase the risk of breathing in (aspirating) secretions their own mouth and nose.  People who havea weakened immune system (immunocompromised).  People who smoke.  People whotravel to areas where pneumonia-causing germs commonly exist.  People whoare around animal habitats or animals that have pneumonia-causing germs, including birds, bats, rabbits, cats, and farm animals.  What are the signs or symptoms? Symptoms of this condition include:  Adry cough.  A wet (productive) cough.  Fever.  Sweating.  Chest pain, especially when breathing deeply or coughing.  Rapid breathing or difficulty breathing.  Shortness of breath.  Shaking chills.  Fatigue.  Muscle aches.  How is this diagnosed? Your health care provider will take a medical history and perform a physical exam. You may also have other tests,  including:  Imaging studies of your chest, including X-rays.  Tests to check your blood oxygen level and other blood gases.  Other tests on blood, mucus (sputum), fluid around your lungs (pleural fluid), and urine.  If your pneumonia is severe, other tests may be done to identify the specific cause of your illness. How is this treated? The type of treatment that you receive depends on many factors, such as the cause of your pneumonia, the medicines you take, and other medical conditions that you have. For most adults, treatment and recovery from pneumonia may occur at home. In some cases, treatment must happen in a hospital. Treatment may include:  Antibiotic medicines, if the pneumonia was caused by bacteria.  Antiviral medicines, if the pneumonia was caused by a virus.  Medicines that are given by mouth or through an IV tube.  Oxygen.  Respiratory therapy.  Although rare, treating severe pneumonia may include:  Mechanical ventilation. This is done if you are not breathing well on your own and you cannot maintain a safe blood oxygen level.  Thoracentesis. This procedureremoves fluid around one lung or both lungs to help you breathe better.  Follow these instructions at home:  Take over-the-counter and prescription medicines only as told by your health care provider. ? Only takecough medicine if you are losing sleep. Understand that cough medicine can prevent your body's natural ability to remove mucus from your lungs. ? If you were prescribed an antibiotic medicine, take it as told by your health care provider. Do not stop taking the antibiotic even if you start to feel better.  Sleep in a semi-upright position at night. Try sleeping in a reclining chair, or place a few pillows under your head.  Do not use tobacco products, including cigarettes, chewing   tobacco, and e-cigarettes. If you need help quitting, ask your health care provider.  Drink enough water to keep your urine  clear or pale yellow. This will help to thin out mucus secretions in your lungs. How is this prevented? There are ways that you can decrease your risk of developing community-acquired pneumonia. Consider getting a pneumococcal vaccine if:  You are older than 67 years of age.  You are older than 67 years of age and are undergoing cancer treatment, have chronic lung disease, or have other medical conditions that affect your immune system. Ask your health care provider if this applies to you.  There are different types and schedules of pneumococcal vaccines. Ask your health care provider which vaccination option is best for you. You may also prevent community-acquired pneumonia if you take these actions:  Get an influenza vaccine every year. Ask your health care provider which type of influenza vaccine is best for you.  Go to the dentist on a regular basis.  Wash your hands often. Use hand sanitizer if soap and water are not available.  Contact a health care provider if:  You have a fever.  You are losing sleep because you cannot control your cough with cough medicine. Get help right away if:  You have worsening shortness of breath.  You have increased chest pain.  Your sickness becomes worse, especially if you are an older adult or have a weakened immune system.  You cough up blood. This information is not intended to replace advice given to you by your health care provider. Make sure you discuss any questions you have with your health care provider. Document Released: 12/27/2004 Document Revised: 05/07/2015 Document Reviewed: 04/23/2014 Elsevier Interactive Patient Education  2018 Elsevier Inc.     IF you received an x-ray today, you will receive an invoice from [facility]. Please contact [facility] at [phone] with questions or concerns regarding your invoice.   IF you received labwork today, you will receive an invoice from [facility]. Please contact  [facility] at [phone] with questions or concerns regarding your invoice.   Our billing staff will not be able to assist you with questions regarding bills from these companies.  You will be contacted with the lab results as soon as they are available. The fastest way to get your results is to activate your My Chart account. Instructions are located on the last page of this paperwork. If you have not heard from us regarding the results in 2 weeks, please contact this office.     

## 2017-06-30 DIAGNOSIS — H1013 Acute atopic conjunctivitis, bilateral: Secondary | ICD-10-CM | POA: Diagnosis not present

## 2017-10-09 DIAGNOSIS — D225 Melanocytic nevi of trunk: Secondary | ICD-10-CM | POA: Diagnosis not present

## 2017-10-09 DIAGNOSIS — D1801 Hemangioma of skin and subcutaneous tissue: Secondary | ICD-10-CM | POA: Diagnosis not present

## 2017-10-09 DIAGNOSIS — Z85828 Personal history of other malignant neoplasm of skin: Secondary | ICD-10-CM | POA: Diagnosis not present

## 2017-10-09 DIAGNOSIS — L821 Other seborrheic keratosis: Secondary | ICD-10-CM | POA: Diagnosis not present

## 2017-10-09 DIAGNOSIS — L814 Other melanin hyperpigmentation: Secondary | ICD-10-CM | POA: Diagnosis not present

## 2017-10-09 DIAGNOSIS — Z23 Encounter for immunization: Secondary | ICD-10-CM | POA: Diagnosis not present

## 2017-10-09 DIAGNOSIS — L309 Dermatitis, unspecified: Secondary | ICD-10-CM | POA: Diagnosis not present

## 2017-10-09 DIAGNOSIS — D235 Other benign neoplasm of skin of trunk: Secondary | ICD-10-CM | POA: Diagnosis not present

## 2017-10-31 DIAGNOSIS — R198 Other specified symptoms and signs involving the digestive system and abdomen: Secondary | ICD-10-CM | POA: Diagnosis not present

## 2017-10-31 DIAGNOSIS — J3081 Allergic rhinitis due to animal (cat) (dog) hair and dander: Secondary | ICD-10-CM | POA: Diagnosis not present

## 2017-10-31 DIAGNOSIS — J4541 Moderate persistent asthma with (acute) exacerbation: Secondary | ICD-10-CM | POA: Diagnosis not present

## 2017-11-02 DIAGNOSIS — Z01419 Encounter for gynecological examination (general) (routine) without abnormal findings: Secondary | ICD-10-CM | POA: Diagnosis not present

## 2017-11-02 DIAGNOSIS — Z780 Asymptomatic menopausal state: Secondary | ICD-10-CM | POA: Diagnosis not present

## 2018-01-06 ENCOUNTER — Encounter: Payer: Self-pay | Admitting: Family Medicine

## 2018-01-06 ENCOUNTER — Ambulatory Visit: Payer: Self-pay | Admitting: Family Medicine

## 2018-01-06 VITALS — BP 125/75 | HR 63 | Temp 97.5°F | Resp 18 | Wt 138.2 lb

## 2018-01-06 DIAGNOSIS — R05 Cough: Secondary | ICD-10-CM

## 2018-01-06 DIAGNOSIS — R0981 Nasal congestion: Secondary | ICD-10-CM

## 2018-01-06 DIAGNOSIS — J329 Chronic sinusitis, unspecified: Secondary | ICD-10-CM

## 2018-01-06 DIAGNOSIS — R059 Cough, unspecified: Secondary | ICD-10-CM

## 2018-01-06 MED ORDER — BENZONATATE 100 MG PO CAPS: 100.0000 mg | ORAL_CAPSULE | Freq: Two times a day (BID) | ORAL | 0 refills | Status: DC | PRN

## 2018-01-06 MED ORDER — AZELASTINE HCL 0.1 % NA SOLN: 1.0000 | Freq: Two times a day (BID) | NASAL | 0 refills | Status: AC

## 2018-01-06 MED ORDER — AMOXICILLIN-POT CLAVULANATE 875-125 MG PO TABS: 1.0000 | ORAL_TABLET | Freq: Two times a day (BID) | ORAL | 0 refills | Status: AC

## 2018-01-06 NOTE — Patient Instructions (Signed)
Sinusitis, Adult  Sinusitis is inflammation of your sinuses. Sinuses are hollow spaces in the bones around your face. Your sinuses are located:   Around your eyes.   In the middle of your forehead.   Behind your nose.   In your cheekbones.  Mucus normally drains out of your sinuses. When your nasal tissues become inflamed or swollen, mucus can become trapped or blocked. This allows bacteria, viruses, and fungi to grow, which leads to infection. Most infections of the sinuses are caused by a virus.  Sinusitis can develop quickly. It can last for up to 4 weeks (acute) or for more than 12 weeks (chronic). Sinusitis often develops after a cold.  What are the causes?  This condition is caused by anything that creates swelling in the sinuses or stops mucus from draining. This includes:   Allergies.   Asthma.   Infection from bacteria or viruses.   Deformities or blockages in your nose or sinuses.   Abnormal growths in the nose (nasal polyps).   Pollutants, such as chemicals or irritants in the air.   Infection from fungi (rare).  What increases the risk?  You are more likely to develop this condition if you:   Have a weak body defense system (immune system).   Do a lot of swimming or diving.   Overuse nasal sprays.   Smoke.  What are the signs or symptoms?  The main symptoms of this condition are pain and a feeling of pressure around the affected sinuses. Other symptoms include:   Stuffy nose or congestion.   Thick drainage from your nose.   Swelling and warmth over the affected sinuses.   Headache.   Upper toothache.   A cough that may get worse at night.   Extra mucus that collects in the throat or the back of the nose (postnasal drip).   Decreased sense of smell and taste.   Fatigue.   A fever.   Sore throat.   Bad breath.  How is this diagnosed?  This condition is diagnosed based on:   Your symptoms.   Your medical history.   A physical exam.   Tests to find out if your condition is  acute or chronic. This may include:  ? Checking your nose for nasal polyps.  ? Viewing your sinuses using a device that has a light (endoscope).  ? Testing for allergies or bacteria.  ? Imaging tests, such as an MRI or CT scan.  In rare cases, a bone biopsy may be done to rule out more serious types of fungal sinus disease.  How is this treated?  Treatment for sinusitis depends on the cause and whether your condition is chronic or acute.   If caused by a virus, your symptoms should go away on their own within 10 days. You may be given medicines to relieve symptoms. They include:  ? Medicines that shrink swollen nasal passages (topical intranasal decongestants).  ? Medicines that treat allergies (antihistamines).  ? A spray that eases inflammation of the nostrils (topical intranasal corticosteroids).  ? Rinses that help get rid of thick mucus in your nose (nasal saline washes).   If caused by bacteria, your health care provider may recommend waiting to see if your symptoms improve. Most bacterial infections will get better without antibiotic medicine. You may be given antibiotics if you have:  ? A severe infection.  ? A weak immune system.   If caused by narrow nasal passages or nasal polyps, you may need   to have surgery.  Follow these instructions at home:  Medicines   Take, use, or apply over-the-counter and prescription medicines only as told by your health care provider. These may include nasal sprays.   If you were prescribed an antibiotic medicine, take it as told by your health care provider. Do not stop taking the antibiotic even if you start to feel better.  Hydrate and humidify     Drink enough fluid to keep your urine pale yellow. Staying hydrated will help to thin your mucus.   Use a cool mist humidifier to keep the humidity level in your home above 50%.   Inhale steam for 10-15 minutes, 3-4 times a day, or as told by your health care provider. You can do this in the bathroom while a hot shower is  running.   Limit your exposure to cool or dry air.  Rest   Rest as much as possible.   Sleep with your head raised (elevated).   Make sure you get enough sleep each night.  General instructions     Apply a warm, moist washcloth to your face 3-4 times a day or as told by your health care provider. This will help with discomfort.   Wash your hands often with soap and water to reduce your exposure to germs. If soap and water are not available, use hand sanitizer.   Do not smoke. Avoid being around people who are smoking (secondhand smoke).   Keep all follow-up visits as told by your health care provider. This is important.  Contact a health care provider if:   You have a fever.   Your symptoms get worse.   Your symptoms do not improve within 10 days.  Get help right away if:   You have a severe headache.   You have persistent vomiting.   You have severe pain or swelling around your face or eyes.   You have vision problems.   You develop confusion.   Your neck is stiff.   You have trouble breathing.  Summary   Sinusitis is soreness and inflammation of your sinuses. Sinuses are hollow spaces in the bones around your face.   This condition is caused by nasal tissues that become inflamed or swollen. The swelling traps or blocks the flow of mucus. This allows bacteria, viruses, and fungi to grow, which leads to infection.   If you were prescribed an antibiotic medicine, take it as told by your health care provider. Do not stop taking the antibiotic even if you start to feel better.   Keep all follow-up visits as told by your health care provider. This is important.  This information is not intended to replace advice given to you by your health care provider. Make sure you discuss any questions you have with your health care provider.  Document Released: 12/27/2004 Document Revised: 05/29/2017 Document Reviewed: 05/29/2017  Elsevier Interactive Patient Education  2019 [employer]

## 2018-01-06 NOTE — Progress Notes (Signed)
Diana Hampton is a 67 y.o. female who presents today with 21 days of nasal congestion, cough, sinus like symptoms for the last 2-3 weeks, she reports she has attempted a plethora of over the counter treatment attempted treatments and this has improved her symptoms some of the time but she report that she has been taking this medication for so long and not really improving. She feels like the medication just masks the symptoms and then they return. She denies fever, chills and the most concerning symptoms she has today is a cough. She does report household contact with similar symptoms but report that she was the first one sick and now the rest of her home has similar symptoms.  Review of Systems  Constitutional: Positive for malaise/fatigue. Negative for chills and fever.  HENT: Positive for congestion, sinus pain and sore throat. Negative for ear discharge and ear pain.   Eyes: Negative.   Respiratory: Positive for cough and sputum production. Negative for shortness of breath.   Cardiovascular: Negative.  Negative for chest pain.  Gastrointestinal: Negative for abdominal pain, diarrhea, nausea and vomiting.  Genitourinary: Negative for dysuria, frequency, hematuria and urgency.  Musculoskeletal: Negative for myalgias.  Skin: Negative.   Neurological: Negative for headaches.  Endo/Heme/Allergies: Negative.   Psychiatric/Behavioral: Negative.     Diana Hampton has a current medication list which includes the following prescription(s): albuterol, atorvastatin, dextromethorphan-guaifenesin, esomeprazole, montelukast, amoxicillin-clavulanate, azelastine, benzonatate, hydrocodone-homatropine, and prednisone. Also has No Known Allergies.  Diana Hampton  has a past medical history of Allergy, Asthma, GERD (gastroesophageal reflux disease), Hyperlipidemia, Malignant neoplasm of corpus uteri, except isthmus ([place]), and Sleep apnea. Also  has a past surgical history that includes Abdominal hysterectomy (2000);  Oophorectomy (2000); Colonoscopy; and Upper gastrointestinal endoscopy.    O: Vitals:   01/06/18 1202  BP: 125/75  Pulse: 63  Resp: 18  Temp: (!) 97.5 F (36.4 C)  SpO2: 98%     Physical Exam Vitals signs reviewed.  Constitutional:      Appearance: She is well-developed. She is not toxic-appearing.  HENT:     Head: Normocephalic.     Right Ear: Hearing, tympanic membrane, ear canal and external ear normal.     Left Ear: Hearing, tympanic membrane, ear canal and external ear normal.     Nose: Mucosal edema, congestion and rhinorrhea present.     Right Sinus: Frontal sinus tenderness present. No maxillary sinus tenderness.     Left Sinus: Frontal sinus tenderness present. No maxillary sinus tenderness.     Mouth/Throat:     Pharynx: Uvula midline. Posterior oropharyngeal erythema present.  Neck:     Musculoskeletal: Normal range of motion and neck supple.  Cardiovascular:     Rate and Rhythm: Normal rate and regular rhythm.     Pulses: Normal pulses.     Heart sounds: Normal heart sounds.  Pulmonary:     Effort: Pulmonary effort is normal.     Breath sounds: Normal breath sounds.  Abdominal:     General: Bowel sounds are normal.     Palpations: Abdomen is soft.  Musculoskeletal: Normal range of motion.  Lymphadenopathy:     Head:     Right side of head: Tonsillar adenopathy present. No submental or submandibular adenopathy.     Left side of head: Tonsillar adenopathy present. No submental or submandibular adenopathy.     Cervical: No cervical adenopathy.  Neurological:     Mental Status: She is alert and oriented to person, place, and time.  A: 1. Sinusitis, unspecified chronicity, unspecified location   2. Nasal congestion   3. Cough    P: 1. Sinusitis, unspecified chronicity, unspecified location - amoxicillin-clavulanate (AUGMENTIN) 875-125 MG tablet; Take 1 tablet by mouth 2 (two) times daily for 7 days.  2. Nasal congestion - azelastine (ASTELIN) 0.1 %  nasal spray; Place 1 spray into both nostrils 2 (two) times daily. Use in each nostril as directed  3. Cough - benzonatate (TESSALON) 100 MG capsule; Take 1 capsule (100 mg total) by mouth 2 (two) times daily as needed for cough.  Other orders - dextromethorphan-guaiFENesin (MUCINEX DM) 30-600 MG 12hr tablet; Take 1 tablet by mouth 2 (two) times daily. - montelukast (SINGULAIR) 10 MG tablet; montelukast 10 mg tablet  Will treat for bacterial sinusitis due to length of symptoms, and exam findings. Discussed with patient exam findings, suspected diagnosis etiology and  reviewed recommended treatment plan and follow up, including complications and indications for urgent medical follow up and evaluation. Medications including use and indications reviewed with patient. Patient provided relevant patient education on diagnosis and/or relevant related condition that were discussed and reviewed with patient at discharge. Patient verbalized understanding of information provided and agrees with plan of care (POC), all questions answered.

## 2018-02-12 DIAGNOSIS — K121 Other forms of stomatitis: Secondary | ICD-10-CM | POA: Diagnosis not present

## 2018-03-13 DIAGNOSIS — K219 Gastro-esophageal reflux disease without esophagitis: Secondary | ICD-10-CM | POA: Diagnosis not present

## 2018-03-13 DIAGNOSIS — Z0000 Encounter for general adult medical examination without abnormal findings: Secondary | ICD-10-CM | POA: Diagnosis not present

## 2018-03-13 DIAGNOSIS — Z1389 Encounter for screening for other disorder: Secondary | ICD-10-CM | POA: Diagnosis not present

## 2018-03-13 DIAGNOSIS — J3089 Other allergic rhinitis: Secondary | ICD-10-CM | POA: Diagnosis not present

## 2018-03-13 DIAGNOSIS — E7800 Pure hypercholesterolemia, unspecified: Secondary | ICD-10-CM | POA: Diagnosis not present

## 2018-03-13 DIAGNOSIS — Z79899 Other long term (current) drug therapy: Secondary | ICD-10-CM | POA: Diagnosis not present

## 2018-03-26 DIAGNOSIS — J4530 Mild persistent asthma, uncomplicated: Secondary | ICD-10-CM | POA: Diagnosis not present

## 2018-03-26 DIAGNOSIS — J309 Allergic rhinitis, unspecified: Secondary | ICD-10-CM | POA: Diagnosis not present

## 2018-03-26 DIAGNOSIS — T781XXD Other adverse food reactions, not elsewhere classified, subsequent encounter: Secondary | ICD-10-CM | POA: Diagnosis not present

## 2018-03-26 DIAGNOSIS — J3081 Allergic rhinitis due to animal (cat) (dog) hair and dander: Secondary | ICD-10-CM | POA: Diagnosis not present

## 2018-06-08 DIAGNOSIS — Z803 Family history of malignant neoplasm of breast: Secondary | ICD-10-CM | POA: Diagnosis not present

## 2018-06-08 DIAGNOSIS — Z1231 Encounter for screening mammogram for malignant neoplasm of breast: Secondary | ICD-10-CM | POA: Diagnosis not present

## 2018-06-22 ENCOUNTER — Encounter: Payer: Self-pay | Admitting: *Deleted

## 2018-06-28 ENCOUNTER — Encounter: Payer: Self-pay | Admitting: Internal Medicine

## 2018-07-17 DIAGNOSIS — R21 Rash and other nonspecific skin eruption: Secondary | ICD-10-CM | POA: Diagnosis not present

## 2018-07-17 DIAGNOSIS — J4530 Mild persistent asthma, uncomplicated: Secondary | ICD-10-CM | POA: Diagnosis not present

## 2018-07-17 DIAGNOSIS — J3081 Allergic rhinitis due to animal (cat) (dog) hair and dander: Secondary | ICD-10-CM | POA: Diagnosis not present

## 2018-07-17 DIAGNOSIS — K210 Gastro-esophageal reflux disease with esophagitis: Secondary | ICD-10-CM | POA: Diagnosis not present

## 2018-07-19 ENCOUNTER — Ambulatory Visit: Payer: Medicare HMO

## 2018-07-19 ENCOUNTER — Encounter: Payer: Self-pay | Admitting: Internal Medicine

## 2018-07-19 ENCOUNTER — Other Ambulatory Visit: Payer: Self-pay

## 2018-07-19 VITALS — Ht 65.0 in | Wt 140.0 lb

## 2018-07-19 DIAGNOSIS — Z800 Family history of malignant neoplasm of digestive organs: Secondary | ICD-10-CM

## 2018-07-19 MED ORDER — SUPREP BOWEL PREP KIT 17.5-3.13-1.6 GM/177ML PO SOLN
1.0000 | Freq: Once | ORAL | 0 refills | Status: AC
Start: 2018-07-19 — End: 2018-07-19

## 2018-07-19 NOTE — Progress Notes (Signed)
Per pt, no allergies to soy or egg products.Pt not taking any weight loss meds or using  O2 at home.  Pt denies sedation problems. Pt refused emmi video.  The PV was done over the phone due to COVID-19. Verified the pt's address and insurance with her. Reviewed medical hx and prep instructions and will mail paperwork to the pt. Informed pt to call with any questions or changes prior to her procedure. Pt understood.

## 2018-07-27 ENCOUNTER — Telehealth: Payer: Self-pay | Admitting: Internal Medicine

## 2018-07-27 NOTE — Telephone Encounter (Signed)

## 2018-07-27 NOTE — Telephone Encounter (Signed)
Pt responded "no" to all screening questions °

## 2018-07-30 ENCOUNTER — Ambulatory Visit (AMBULATORY_SURGERY_CENTER): Payer: Medicare HMO | Admitting: Internal Medicine

## 2018-07-30 ENCOUNTER — Other Ambulatory Visit: Payer: Self-pay

## 2018-07-30 ENCOUNTER — Encounter: Payer: Self-pay | Admitting: Internal Medicine

## 2018-07-30 VITALS — BP 124/85 | HR 77 | Temp 98.3°F | Resp 20 | Ht 65.0 in | Wt 140.0 lb

## 2018-07-30 DIAGNOSIS — K219 Gastro-esophageal reflux disease without esophagitis: Secondary | ICD-10-CM | POA: Diagnosis not present

## 2018-07-30 DIAGNOSIS — D122 Benign neoplasm of ascending colon: Secondary | ICD-10-CM | POA: Diagnosis not present

## 2018-07-30 DIAGNOSIS — J45909 Unspecified asthma, uncomplicated: Secondary | ICD-10-CM | POA: Diagnosis not present

## 2018-07-30 DIAGNOSIS — Z1211 Encounter for screening for malignant neoplasm of colon: Secondary | ICD-10-CM | POA: Diagnosis not present

## 2018-07-30 DIAGNOSIS — D123 Benign neoplasm of transverse colon: Secondary | ICD-10-CM | POA: Diagnosis not present

## 2018-07-30 DIAGNOSIS — Z800 Family history of malignant neoplasm of digestive organs: Secondary | ICD-10-CM

## 2018-07-30 DIAGNOSIS — G4733 Obstructive sleep apnea (adult) (pediatric): Secondary | ICD-10-CM | POA: Diagnosis not present

## 2018-07-30 DIAGNOSIS — K635 Polyp of colon: Secondary | ICD-10-CM | POA: Diagnosis not present

## 2018-07-30 DIAGNOSIS — D125 Benign neoplasm of sigmoid colon: Secondary | ICD-10-CM

## 2018-07-30 MED ORDER — SODIUM CHLORIDE 0.9 % IV SOLN: 500.0000 mL | Freq: Once | INTRAVENOUS | Status: DC

## 2018-07-30 NOTE — Progress Notes (Signed)
Called to room to assist during endoscopic procedure.  Patient ID and intended procedure confirmed with present staff. Received instructions for my participation in the procedure from the performing physician.  

## 2018-07-30 NOTE — Progress Notes (Signed)
Report to PACU, RN, vss, BBS= Clear.  

## 2018-07-30 NOTE — Patient Instructions (Signed)
Handouts given for polyps, diverticulosis and hemorrhoids  YOU HAD AN ENDOSCOPIC PROCEDURE TODAY AT THE [facility]:   Refer to the procedure report that was given to you for any specific questions about what was found during the examination.  If the procedure report does not answer your questions, please call your gastroenterologist to clarify.  If you requested that your care partner not be given the details of your procedure findings, then the procedure report has been included in a sealed envelope for you to review at your convenience later.  YOU SHOULD EXPECT: Some feelings of bloating in the abdomen. Passage of more gas than usual.  Walking can help get rid of the air that was put into your GI tract during the procedure and reduce the bloating. If you had a lower endoscopy (such as a colonoscopy or flexible sigmoidoscopy) you may notice spotting of blood in your stool or on the toilet paper. If you underwent a bowel prep for your procedure, you may not have a normal bowel movement for a few days.  Please Note:  You might notice some irritation and congestion in your nose or some drainage.  This is from the oxygen used during your procedure.  There is no need for concern and it should clear up in a day or so.  SYMPTOMS TO REPORT IMMEDIATELY:   Following lower endoscopy (colonoscopy or flexible sigmoidoscopy):  Excessive amounts of blood in the stool  Significant tenderness or worsening of abdominal pains  Swelling of the abdomen that is new, acute  Fever of 100F or higher  For urgent or emergent issues, a gastroenterologist can be reached at any hour by calling [phone].   DIET:  We do recommend a small meal at first, but then you may proceed to your regular diet.  Drink plenty of fluids but you should avoid alcoholic beverages for 24 hours.  ACTIVITY:  You should plan to take it easy for the rest of today and you should NOT DRIVE or use heavy machinery until tomorrow  (because of the sedation medicines used during the test).    FOLLOW UP: Our staff will call the number listed on your records 48-72 hours following your procedure to check on you and address any questions or concerns that you may have regarding the information given to you following your procedure. If we do not reach you, we will leave a message.  We will attempt to reach you two times.  During this call, we will ask if you have developed any symptoms of COVID 19. If you develop any symptoms (ie: fever, flu-like symptoms, shortness of breath, cough etc.) before then, please call [phone].  If you test positive for Covid 19 in the 2 weeks post procedure, please call and report this information to us.    If any biopsies were taken you will be contacted by phone or by letter within the next 1-3 weeks.  Please call us at [phone] if you have not heard about the biopsies in 3 weeks.    SIGNATURES/CONFIDENTIALITY: You and/or your care partner have signed paperwork which will be entered into your electronic medical record.  These signatures attest to the fact that that the information above on your After Visit Summary has been reviewed and is understood.  Full responsibility of the confidentiality of this discharge information lies with you and/or your care-partner. 

## 2018-07-30 NOTE — Op Note (Signed)
[facility] Patient Name: Diana Hampton Procedure Date: 07/30/2018 2:43 PM MRN: [id number] Endoscopist: Jerene Bears , MD Age: 68 Referring MD:  Date of Birth: August 23, 1950 Gender: Female Account #: [id number] Procedure:                Colonoscopy Indications:              Screening in patient at increased risk: Family                            history of 1st-degree relative with colorectal                            cancer before age 49 years, Last colonoscopy 5                            years ago Medicines:                Monitored Anesthesia Care Procedure:                Pre-Anesthesia Assessment:                           - Prior to the procedure, a History and Physical                            was performed, and patient medications and                            allergies were reviewed. The patient's tolerance of                            previous anesthesia was also reviewed. The risks                            and benefits of the procedure and the sedation                            options and risks were discussed with the patient.                            All questions were answered, and informed consent                            was obtained. Prior Anticoagulants: The patient has                            taken no previous anticoagulant or antiplatelet                            agents. ASA Grade Assessment: II - A patient with                            mild systemic disease. After reviewing the risks  and benefits, the patient was deemed in                            satisfactory condition to undergo the procedure.                           After obtaining informed consent, the colonoscope                            was passed under direct vision. Throughout the                            procedure, the patient's blood pressure, pulse, and                            oxygen saturations were monitored continuously. The                 Colonoscope was introduced through the anus and                            advanced to the cecum, identified by appendiceal                            orifice and ileocecal valve. Scope In: 2:58:08 PM Scope Out: 3:14:10 PM Scope Withdrawal Time: 0 hours 12 minutes 2 seconds  Total Procedure Duration: 0 hours 16 minutes 2 seconds  Findings:                 The perianal and digital rectal examinations were                            normal.                           A 5 mm polyp was found in the transverse colon. The                            polyp was sessile. The polyp was removed with a                            cold snare. Resection and retrieval were complete.                           Two sessile polyps were found in the sigmoid colon.                            The polyps were 2 to 3 mm in size. These polyps                            were removed with a cold snare. Resection and                            retrieval were complete.  Multiple medium-mouthed diverticula were found in                            the sigmoid colon.                           Internal hemorrhoids were found during                            retroflexion. The hemorrhoids were small. Complications:            No immediate complications. Estimated Blood Loss:     Estimated blood loss was minimal. Impression:               - One 5 mm polyp in the transverse colon, removed                            with a cold snare. Resected and retrieved.                           - Two 2 to 3 mm polyps in the sigmoid colon,                            removed with a cold snare. Resected and retrieved.                           - Diverticulosis in the sigmoid colon.                           - Small internal hemorrhoids. Recommendation:           - Patient has a contact number available for                            emergencies. The signs and symptoms of potential                             delayed complications were discussed with the                            patient. Return to normal activities tomorrow.                            Written discharge instructions were provided to the                            patient.                           - Resume previous diet.                           - Continue present medications.                           - Await pathology results.                           -  Repeat colonoscopy is recommended for                            surveillance. The colonoscopy date will be                            determined after pathology results from today's                            exam become available for review. Jerene Bears, MD 07/30/2018 3:21:25 PM This report has been signed electronically.

## 2018-07-30 NOTE — Progress Notes (Signed)
Pt's states no medical or surgical changes since previsit or office visit. 

## 2018-07-30 NOTE — Progress Notes (Signed)
Fayrene Fearing took temp and Rica Mote took vitals.

## 2018-08-01 ENCOUNTER — Telehealth: Payer: Self-pay

## 2018-08-01 NOTE — Telephone Encounter (Signed)
  Follow up Call-  Call back number 07/30/2018  Post procedure Call Back phone  # [phone]  Permission to leave phone message Yes  Some recent data might be hidden     Patient questions:  Do you have a fever, pain , or abdominal swelling? No. Pain Score  0 *  Have you tolerated food without any problems? Yes.    Have you been able to return to your normal activities? Yes.    Do you have any questions about your discharge instructions: Diet   No. Medications  No. Follow up visit  No.  Do you have questions or concerns about your Care? No.  Actions: * If pain score is 4 or above: No action needed, pain <4. 1. Have you developed a fever since your procedure? no  2.   Have you had an respiratory symptoms (SOB or cough) since your procedure? no  3.   Have you tested positive for COVID 19 since your procedure no  4.   Have you had any family members/close contacts diagnosed with the COVID 19 since your procedure?  no   If yes to any of these questions please route to Joylene John, RN and Alphonsa Gin, Therapist, sports.

## 2018-08-04 ENCOUNTER — Encounter: Payer: Self-pay | Admitting: Internal Medicine

## 2018-08-09 DIAGNOSIS — H2513 Age-related nuclear cataract, bilateral: Secondary | ICD-10-CM | POA: Diagnosis not present

## 2018-08-09 DIAGNOSIS — H52203 Unspecified astigmatism, bilateral: Secondary | ICD-10-CM | POA: Diagnosis not present

## 2018-08-09 DIAGNOSIS — H524 Presbyopia: Secondary | ICD-10-CM | POA: Diagnosis not present

## 2018-08-09 DIAGNOSIS — H5213 Myopia, bilateral: Secondary | ICD-10-CM | POA: Diagnosis not present

## 2018-10-16 DIAGNOSIS — D225 Melanocytic nevi of trunk: Secondary | ICD-10-CM | POA: Diagnosis not present

## 2018-10-16 DIAGNOSIS — Z23 Encounter for immunization: Secondary | ICD-10-CM | POA: Diagnosis not present

## 2018-10-16 DIAGNOSIS — L814 Other melanin hyperpigmentation: Secondary | ICD-10-CM | POA: Diagnosis not present

## 2018-10-16 DIAGNOSIS — L821 Other seborrheic keratosis: Secondary | ICD-10-CM | POA: Diagnosis not present

## 2018-10-16 DIAGNOSIS — Z85828 Personal history of other malignant neoplasm of skin: Secondary | ICD-10-CM | POA: Diagnosis not present

## 2018-10-16 DIAGNOSIS — D235 Other benign neoplasm of skin of trunk: Secondary | ICD-10-CM | POA: Diagnosis not present

## 2018-10-19 DIAGNOSIS — R69 Illness, unspecified: Secondary | ICD-10-CM | POA: Diagnosis not present

## 2019-01-21 DIAGNOSIS — J4530 Mild persistent asthma, uncomplicated: Secondary | ICD-10-CM | POA: Diagnosis not present

## 2019-01-21 DIAGNOSIS — K2100 Gastro-esophageal reflux disease with esophagitis, without bleeding: Secondary | ICD-10-CM | POA: Diagnosis not present

## 2019-01-21 DIAGNOSIS — R21 Rash and other nonspecific skin eruption: Secondary | ICD-10-CM | POA: Diagnosis not present

## 2019-01-21 DIAGNOSIS — J3081 Allergic rhinitis due to animal (cat) (dog) hair and dander: Secondary | ICD-10-CM | POA: Diagnosis not present

## 2019-01-30 ENCOUNTER — Ambulatory Visit: Payer: Medicare Other | Attending: Internal Medicine

## 2019-01-30 DIAGNOSIS — Z23 Encounter for immunization: Secondary | ICD-10-CM | POA: Insufficient documentation

## 2019-01-30 NOTE — Progress Notes (Signed)
   Covid-19 Vaccination Clinic  Name:  Diana Hampton    MRN: [id number] DOB: Jun 10, 1950  01/30/2019  [name] was observed post Covid-19 immunization for 15 minutes without incidence. She was provided with Vaccine Information Sheet and instruction to access the V-Safe system.   [name] was instructed to call 911 with any severe reactions post vaccine: Marland Kitchen Difficulty breathing  . Swelling of your face and throat  . A fast heartbeat  . A bad rash all over your body  . Dizziness and weakness    Immunizations Administered    Name Date Dose VIS Date Route   Pfizer COVID-19 Vaccine 01/30/2019  5:40 PM 0.3 mL 12/21/2018 Intramuscular   Manufacturer: [place]   Lot: [id number]   [place]: [id number]

## 2019-02-07 DIAGNOSIS — Z01419 Encounter for gynecological examination (general) (routine) without abnormal findings: Secondary | ICD-10-CM | POA: Diagnosis not present

## 2019-02-20 ENCOUNTER — Ambulatory Visit: Payer: Medicare HMO | Attending: Internal Medicine

## 2019-02-20 DIAGNOSIS — Z23 Encounter for immunization: Secondary | ICD-10-CM | POA: Insufficient documentation

## 2019-02-20 NOTE — Progress Notes (Signed)
   Covid-19 Vaccination Clinic  Name:  Diana Hampton    MRN: [id number] DOB: 07-01-1950  02/20/2019  [name] was observed post Covid-19 immunization for 15 minutes without incidence. She was provided with Vaccine Information Sheet and instruction to access the V-Safe system.   [name] was instructed to call 911 with any severe reactions post vaccine: Marland Kitchen Difficulty breathing  . Swelling of your face and throat  . A fast heartbeat  . A bad rash all over your body  . Dizziness and weakness    Immunizations Administered    Name Date Dose VIS Date Route   Pfizer COVID-19 Vaccine 02/20/2019  4:16 PM 0.3 mL 12/21/2018 Intramuscular   Manufacturer: [place]   Lot: [id number]   [place]: [id number]

## 2019-03-19 DIAGNOSIS — G4733 Obstructive sleep apnea (adult) (pediatric): Secondary | ICD-10-CM | POA: Diagnosis not present

## 2019-03-19 DIAGNOSIS — Z1159 Encounter for screening for other viral diseases: Secondary | ICD-10-CM | POA: Diagnosis not present

## 2019-03-19 DIAGNOSIS — K219 Gastro-esophageal reflux disease without esophagitis: Secondary | ICD-10-CM | POA: Diagnosis not present

## 2019-03-19 DIAGNOSIS — E7800 Pure hypercholesterolemia, unspecified: Secondary | ICD-10-CM | POA: Diagnosis not present

## 2019-03-19 DIAGNOSIS — Z79899 Other long term (current) drug therapy: Secondary | ICD-10-CM | POA: Diagnosis not present

## 2019-03-19 DIAGNOSIS — Z1389 Encounter for screening for other disorder: Secondary | ICD-10-CM | POA: Diagnosis not present

## 2019-03-19 DIAGNOSIS — Z0000 Encounter for general adult medical examination without abnormal findings: Secondary | ICD-10-CM | POA: Diagnosis not present

## 2019-03-27 DIAGNOSIS — M8589 Other specified disorders of bone density and structure, multiple sites: Secondary | ICD-10-CM | POA: Diagnosis not present

## 2019-03-28 DIAGNOSIS — H6690 Otitis media, unspecified, unspecified ear: Secondary | ICD-10-CM | POA: Diagnosis not present

## 2019-05-10 DIAGNOSIS — D485 Neoplasm of uncertain behavior of skin: Secondary | ICD-10-CM | POA: Diagnosis not present

## 2019-05-10 DIAGNOSIS — C4442 Squamous cell carcinoma of skin of scalp and neck: Secondary | ICD-10-CM | POA: Diagnosis not present

## 2019-05-22 DIAGNOSIS — C4442 Squamous cell carcinoma of skin of scalp and neck: Secondary | ICD-10-CM | POA: Diagnosis not present

## 2019-06-11 DIAGNOSIS — Z1231 Encounter for screening mammogram for malignant neoplasm of breast: Secondary | ICD-10-CM | POA: Diagnosis not present

## 2019-07-29 DIAGNOSIS — L821 Other seborrheic keratosis: Secondary | ICD-10-CM | POA: Diagnosis not present

## 2019-07-29 DIAGNOSIS — L820 Inflamed seborrheic keratosis: Secondary | ICD-10-CM | POA: Diagnosis not present

## 2019-07-29 DIAGNOSIS — D485 Neoplasm of uncertain behavior of skin: Secondary | ICD-10-CM | POA: Diagnosis not present

## 2019-07-29 DIAGNOSIS — D045 Carcinoma in situ of skin of trunk: Secondary | ICD-10-CM | POA: Diagnosis not present

## 2019-08-13 DIAGNOSIS — H524 Presbyopia: Secondary | ICD-10-CM | POA: Diagnosis not present

## 2019-08-13 DIAGNOSIS — H52203 Unspecified astigmatism, bilateral: Secondary | ICD-10-CM | POA: Diagnosis not present

## 2019-08-13 DIAGNOSIS — H2513 Age-related nuclear cataract, bilateral: Secondary | ICD-10-CM | POA: Diagnosis not present

## 2019-09-04 DIAGNOSIS — D045 Carcinoma in situ of skin of trunk: Secondary | ICD-10-CM | POA: Diagnosis not present

## 2019-09-04 DIAGNOSIS — L820 Inflamed seborrheic keratosis: Secondary | ICD-10-CM | POA: Diagnosis not present

## 2019-10-24 DIAGNOSIS — L814 Other melanin hyperpigmentation: Secondary | ICD-10-CM | POA: Diagnosis not present

## 2019-10-24 DIAGNOSIS — L570 Actinic keratosis: Secondary | ICD-10-CM | POA: Diagnosis not present

## 2019-10-24 DIAGNOSIS — L578 Other skin changes due to chronic exposure to nonionizing radiation: Secondary | ICD-10-CM | POA: Diagnosis not present

## 2019-10-24 DIAGNOSIS — D225 Melanocytic nevi of trunk: Secondary | ICD-10-CM | POA: Diagnosis not present

## 2019-10-24 DIAGNOSIS — L821 Other seborrheic keratosis: Secondary | ICD-10-CM | POA: Diagnosis not present

## 2019-10-24 DIAGNOSIS — L720 Epidermal cyst: Secondary | ICD-10-CM | POA: Diagnosis not present

## 2019-10-24 DIAGNOSIS — Z85828 Personal history of other malignant neoplasm of skin: Secondary | ICD-10-CM | POA: Diagnosis not present

## 2019-10-24 DIAGNOSIS — D235 Other benign neoplasm of skin of trunk: Secondary | ICD-10-CM | POA: Diagnosis not present

## 2019-10-24 DIAGNOSIS — L918 Other hypertrophic disorders of the skin: Secondary | ICD-10-CM | POA: Diagnosis not present

## 2019-11-28 DIAGNOSIS — R69 Illness, unspecified: Secondary | ICD-10-CM | POA: Diagnosis not present

## 2019-12-28 ENCOUNTER — Ambulatory Visit: Payer: Medicare HMO | Attending: Internal Medicine

## 2019-12-28 DIAGNOSIS — Z23 Encounter for immunization: Secondary | ICD-10-CM

## 2019-12-28 NOTE — Progress Notes (Signed)
   Covid-19 Vaccination Clinic  Name:  Diana Hampton    MRN: [id number] DOB: 12-20-1950  12/28/2019  [name] was observed post Covid-19 immunization for 15 minutes without incident. She was provided with Vaccine Information Sheet and instruction to access the V-Safe system.   [name] was instructed to call 911 with any severe reactions post vaccine: Marland Kitchen Difficulty breathing  . Swelling of face and throat  . A fast heartbeat  . A bad rash all over body  . Dizziness and weakness   Immunizations Administered    Name Date Dose VIS Date Route   Pfizer COVID-19 Vaccine 12/28/2019  9:27 AM 0.3 mL 10/30/2019 Intramuscular   Manufacturer: [place]   Lot: [id number]   [place]: [id number]

## 2020-01-20 DIAGNOSIS — J3081 Allergic rhinitis due to animal (cat) (dog) hair and dander: Secondary | ICD-10-CM | POA: Diagnosis not present

## 2020-01-20 DIAGNOSIS — J4530 Mild persistent asthma, uncomplicated: Secondary | ICD-10-CM | POA: Diagnosis not present

## 2020-01-20 DIAGNOSIS — K219 Gastro-esophageal reflux disease without esophagitis: Secondary | ICD-10-CM | POA: Diagnosis not present

## 2020-01-20 DIAGNOSIS — R21 Rash and other nonspecific skin eruption: Secondary | ICD-10-CM | POA: Diagnosis not present

## 2020-03-20 DIAGNOSIS — Z79899 Other long term (current) drug therapy: Secondary | ICD-10-CM | POA: Diagnosis not present

## 2020-03-20 DIAGNOSIS — Z0000 Encounter for general adult medical examination without abnormal findings: Secondary | ICD-10-CM | POA: Diagnosis not present

## 2020-03-20 DIAGNOSIS — Z1331 Encounter for screening for depression: Secondary | ICD-10-CM | POA: Diagnosis not present

## 2020-03-20 DIAGNOSIS — E7800 Pure hypercholesterolemia, unspecified: Secondary | ICD-10-CM | POA: Diagnosis not present

## 2020-03-20 DIAGNOSIS — Z1389 Encounter for screening for other disorder: Secondary | ICD-10-CM | POA: Diagnosis not present

## 2020-03-20 DIAGNOSIS — G4733 Obstructive sleep apnea (adult) (pediatric): Secondary | ICD-10-CM | POA: Diagnosis not present

## 2020-03-20 DIAGNOSIS — Z23 Encounter for immunization: Secondary | ICD-10-CM | POA: Diagnosis not present

## 2020-03-20 DIAGNOSIS — K219 Gastro-esophageal reflux disease without esophagitis: Secondary | ICD-10-CM | POA: Diagnosis not present

## 2020-06-07 ENCOUNTER — Other Ambulatory Visit: Payer: Self-pay

## 2020-06-07 ENCOUNTER — Emergency Department (HOSPITAL_BASED_OUTPATIENT_CLINIC_OR_DEPARTMENT_OTHER)
Admission: EM | Admit: 2020-06-07 | Discharge: 2020-06-07 | Disposition: A | Discharge status: Home / Self Care | Payer: Medicare HMO | Attending: Emergency Medicine | Admitting: Emergency Medicine

## 2020-06-07 DIAGNOSIS — Z8541 Personal history of malignant neoplasm of cervix uteri: Secondary | ICD-10-CM | POA: Diagnosis not present

## 2020-06-07 DIAGNOSIS — R42 Dizziness and giddiness: Secondary | ICD-10-CM | POA: Insufficient documentation

## 2020-06-07 DIAGNOSIS — Z7951 Long term (current) use of inhaled steroids: Secondary | ICD-10-CM | POA: Diagnosis not present

## 2020-06-07 DIAGNOSIS — Z79899 Other long term (current) drug therapy: Secondary | ICD-10-CM | POA: Diagnosis not present

## 2020-06-07 DIAGNOSIS — J45909 Unspecified asthma, uncomplicated: Secondary | ICD-10-CM | POA: Diagnosis not present

## 2020-06-07 LAB — COMPREHENSIVE METABOLIC PANEL
ALT: 19 U/L (ref 0–44)
AST: 17 U/L (ref 15–41)
Albumin: 3.9 g/dL (ref 3.5–5.0)
Alkaline Phosphatase: 70 U/L (ref 38–126)
Anion gap: 8 (ref 5–15)
BUN: 14 mg/dL (ref 8–23)
CO2: 26 mmol/L (ref 22–32)
Calcium: 9.3 mg/dL (ref 8.9–10.3)
Chloride: 104 mmol/L (ref 98–111)
Creatinine, Ser: 0.95 mg/dL (ref 0.44–1.00)
GFR, Estimated: >60 mL/min (ref >60)
Glucose, Bld: 107 mg/dL — ABNORMAL HIGH (ref 70–99)
Potassium: 4 mmol/L (ref 3.5–5.1)
Sodium: 138 mmol/L (ref 135–145)
Total Bilirubin: 0.5 mg/dL (ref 0.3–1.2)
Total Protein: 7.5 g/dL (ref 6.5–8.1)

## 2020-06-07 LAB — CBC WITH DIFFERENTIAL/PLATELET
Abs Immature Granulocytes: 0.02 10*3/uL (ref 0.00–0.07)
Basophils Absolute: 0.1 10*3/uL (ref 0.0–0.1)
Basophils Relative: 1 %
Eosinophils Absolute: 0.2 10*3/uL (ref 0.0–0.5)
Eosinophils Relative: 3 %
HCT: 40.6 % (ref 36.0–46.0)
Hemoglobin: 13.5 g/dL (ref 12.0–15.0)
Immature Granulocytes: 0 %
Lymphocytes Relative: 17 %
Lymphs Abs: 1.1 10*3/uL (ref 0.7–4.0)
MCH: 30.4 pg (ref 26.0–34.0)
MCHC: 33.3 g/dL (ref 30.0–36.0)
MCV: 91.4 fL (ref 80.0–100.0)
Monocytes Absolute: 0.5 10*3/uL (ref 0.1–1.0)
Monocytes Relative: 8 %
Neutro Abs: 4.9 10*3/uL (ref 1.7–7.7)
Neutrophils Relative %: 71 %
Platelets: 332 10*3/uL (ref 150–400)
RBC: 4.44 MIL/uL (ref 3.87–5.11)
RDW: 13.4 % (ref 11.5–15.5)
WBC: 6.9 10*3/uL (ref 4.0–10.5)
nRBC: 0 % (ref 0.0–0.2)

## 2020-06-07 MED ORDER — ONDANSETRON 4 MG PO TBDP: 4.0000 mg | ORAL_TABLET | Freq: Three times a day (TID) | ORAL | 0 refills | Status: DC | PRN

## 2020-06-07 MED ORDER — ONDANSETRON HCL 4 MG/2ML IJ SOLN
4.0000 mg | Freq: Once | INTRAMUSCULAR | Status: AC
  Administered 2020-06-07: 4 mg via INTRAVENOUS
  Filled 2020-06-07: qty 2

## 2020-06-07 MED ORDER — MECLIZINE HCL 25 MG PO TABS: 25.0000 mg | ORAL_TABLET | Freq: Three times a day (TID) | ORAL | 0 refills | Status: AC | PRN

## 2020-06-07 MED ORDER — MECLIZINE HCL 25 MG PO TABS: 25.0000 mg | ORAL_TABLET | Freq: Three times a day (TID) | ORAL | 0 refills | Status: DC | PRN

## 2020-06-07 MED ORDER — MECLIZINE HCL 25 MG PO TABS
25.0000 mg | ORAL_TABLET | Freq: Once | ORAL | Status: AC
  Administered 2020-06-07: 25 mg via ORAL
  Filled 2020-06-07: qty 1

## 2020-06-07 MED ORDER — ONDANSETRON 4 MG PO TBDP: 4.0000 mg | ORAL_TABLET | Freq: Three times a day (TID) | ORAL | 0 refills | Status: AC | PRN

## 2020-06-07 MED ORDER — SODIUM CHLORIDE 0.9 % IV BOLUS
1000.0000 mL | Freq: Once | INTRAVENOUS | Status: AC
Start: 2020-06-07 — End: 2020-06-07
  Administered 2020-06-07: 1000 mL via INTRAVENOUS

## 2020-06-07 NOTE — ED Provider Notes (Signed)
[facility] EMERGENCY DEPARTMENT Provider Note   CSN: [id number] Arrival date & time: 06/07/20  1517     History Chief Complaint  Patient presents with  . Dizziness    Diana Hampton is a 70 y.o. female.  HPI      70 year old female with a history of asthma, sleep apnea, uterine cancer, tinnitus, presents with concern for vertigo.  Reports that she woke up at 6 AM this morning with severe vertigo.  Reports it is a if the room is spinning.  Seems to be coming and was seems to be coming and going some, and worsens with movements or standing. Denies numbness, weakness, difficulty talking, visual changes or facial droop.  Reports that she feels walking is difficult because she feels so dizzy.  Has history of ringing in her ears but has never been diagnosed with meniere's or vertigo.  Denies any new ear symptoms. Denies congestion/fever/CP/dyspnea/black or bloody stools. Denies headache, trauma. Reports sitting in the car on the way here when she was not moving she was feeling improved but as soon as she got up the symptoms again became severe.   Past Medical History:  Diagnosis Date  . Allergy   . Asthma   . GERD (gastroesophageal reflux disease)   . Hyperlipidemia   . Malignant neoplasm of corpus uteri, except isthmus ([place]) 2000   uterine cancer  . Sleep apnea    does not use c-pap    Patient Active Problem List   Diagnosis Date Noted  . Right-sided chest pain 05/14/2015  . Thoracic back pain 05/14/2015  . Seasonal allergies 06/24/2014  . GERD (gastroesophageal reflux disease) 06/24/2014  . OSA (obstructive sleep apnea) 12/15/2010  . Routine health maintenance 11/21/2010  . MALIGNANT NEOPLASM CORPUS UTERI EXCEPT ISTHMUS 04/21/2008  . Hyperlipidemia 09/28/2006    Past Surgical History:  Procedure Laterality Date  . ABDOMINAL HYSTERECTOMY  2000  . COLONOSCOPY    . OOPHORECTOMY  2000  . UPPER GASTROINTESTINAL ENDOSCOPY       OB History   No obstetric  history on file.     Family History  Problem Relation Age of Onset  . Hypertension Mother   . Colon cancer Mother   . Rectal cancer Mother   . Cancer - Colon Father 66  . Colon cancer Father   . Heart disease Brother 41       MI  . Colon cancer Brother 32       2 brothers  . Cancer Brother 58       lung ca  . Brain cancer Brother   . Diabetes Other        Grandfather  . Lung cancer Brother   . Esophageal cancer Neg Hx   . Pancreatic cancer Neg Hx   . Prostate cancer Neg Hx   . Stomach cancer Neg Hx     Social History   Tobacco Use  . Smoking status: Never Smoker  . Smokeless tobacco: Never Used  Substance Use Topics  . Alcohol use: No  . Drug use: No    Home Medications Prior to Admission medications   Medication Sig Start Date End Date Taking? Authorizing Provider  albuterol (PROVENTIL HFA;VENTOLIN HFA) 108 (90 Base) MCG/ACT inhaler Inhale 2 puffs into the lungs every 6 (six) hours as needed. 04/27/17  Yes Jaynee Eagles, PA-C  atorvastatin (LIPITOR) 20 MG tablet TAKE 1 BY MOUTH DAILY -GREGORY CALONE,PA 05/21/15  Yes Golden Circle, FNP  azelastine (ASTELIN) 0.1 % nasal  spray Place 1 spray into both nostrils 2 (two) times daily. Use in each nostril as directed Patient taking differently: Place 1 spray into both nostrils as needed. Use in each nostril as directed 01/06/18  Yes Shella Maxim, NP  montelukast (SINGULAIR) 10 MG tablet at bedtime.    Yes [provider]  benzonatate (TESSALON) 100 MG capsule Take 1 capsule (100 mg total) by mouth 2 (two) times daily as needed for cough. Patient not taking: No sig reported 01/06/18   Shella Maxim, NP  dextromethorphan-guaiFENesin [facility] DM) 30-600 MG 12hr tablet Take 1 tablet by mouth 2 (two) times daily.    [provider]  esomeprazole (NEXIUM) 20 MG capsule Take 20 mg by mouth daily at 12 noon.    [provider]  fluticasone furoate-vilanterol (BREO ELLIPTA) 100-25 MCG/INH AEPB Inhale 1 puff  into the lungs daily.    [provider]  Fluticasone-Salmeterol (ADVAIR DISKUS IN) Inhale into the lungs 2 (two) times daily.    [provider]  HYDROcodone-homatropine (HYCODAN) 5-1.5 MG/5ML syrup Take 5 mLs by mouth at bedtime as needed. Patient not taking: No sig reported 04/27/17   Jaynee Eagles, PA-C  meclizine (ANTIVERT) 25 MG tablet Take 1 tablet (25 mg total) by mouth 3 (three) times daily as needed for dizziness. 06/07/20   Gareth Morgan, MD  ondansetron (ZOFRAN ODT) 4 MG disintegrating tablet Take 1 tablet (4 mg total) by mouth every 8 (eight) hours as needed for nausea or vomiting. 06/07/20   Gareth Morgan, MD  predniSONE (DELTASONE) 20 MG tablet Take 2 tablets daily with breakfast. Patient not taking: No sig reported 04/27/17   Jaynee Eagles, PA-C    Allergies    Patient has no known allergies.  Review of Systems   Review of Systems  Constitutional: Negative for fever.  HENT: Negative for sore throat.   Eyes: Negative for visual disturbance.  Respiratory: Negative for cough and shortness of breath.   Cardiovascular: Negative for chest pain.  Gastrointestinal: Positive for nausea and vomiting. Negative for abdominal pain.  Genitourinary: Negative for difficulty urinating.  Musculoskeletal: Negative for back pain and neck pain.  Skin: Negative for rash.  Neurological: Positive for dizziness. Negative for syncope, facial asymmetry, speech difficulty, weakness, numbness and headaches.    Physical Exam Updated Vital Signs BP (!) 155/71 (BP Location: Right Arm)   Pulse (!) 51   Temp 98.2 F (36.8 C) (Oral)   Resp 14   Ht 5\' 5"  (1.651 m)   Wt 66.2 kg   SpO2 100%   BMI 24.30 kg/m   Physical Exam Vitals and nursing note reviewed.  Constitutional:      General: She is not in acute distress.    Appearance: Normal appearance. She is well-developed. She is not ill-appearing or diaphoretic.  HENT:     Head: Normocephalic and atraumatic.  Eyes:      General: No visual field deficit.    Extraocular Movements: Extraocular movements intact.     Conjunctiva/sclera: Conjunctivae normal.     Pupils: Pupils are equal, round, and reactive to light.  Cardiovascular:     Rate and Rhythm: Normal rate and regular rhythm.     Pulses: Normal pulses.     Heart sounds: Normal heart sounds. No murmur heard. No friction rub. No gallop.   Pulmonary:     Effort: Pulmonary effort is normal. No respiratory distress.     Breath sounds: Normal breath sounds. No wheezing or rales.  Abdominal:  General: There is no distension.     Palpations: Abdomen is soft.     Tenderness: There is no abdominal tenderness. There is no guarding.  Musculoskeletal:        General: No swelling or tenderness.     Cervical back: Normal range of motion.  Skin:    General: Skin is warm and dry.     Findings: No erythema or rash.  Neurological:     General: No focal deficit present.     Mental Status: She is alert and oriented to person, place, and time.     GCS: GCS eye subscore is 4. GCS verbal subscore is 5. GCS motor subscore is 6.     Cranial Nerves: No cranial nerve deficit, dysarthria or facial asymmetry.     Sensory: Sensation is intact. No sensory deficit.     Motor: Motor function is intact. No weakness, tremor or pronator drift.     Coordination: Romberg sign negative. Coordination normal. Finger-Nose-Finger Test and Heel to Shin Test normal.     Gait: Gait normal.     ED Results / Procedures / Treatments   Labs (all labs ordered are listed, but only abnormal results are displayed) Labs Reviewed  COMPREHENSIVE METABOLIC PANEL - Abnormal; Notable for the following components:      Result Value   Glucose, Bld 107 (*)    All other components within normal limits  CBC WITH DIFFERENTIAL/PLATELET    EKG EKG Interpretation  Date/Time:  Sunday Jun 07 2020 09:43:03 EDT Ventricular Rate:  57 PR Interval:  142 QRS Duration: 97 QT Interval:  407 QTC  Calculation: 397 R Axis:   51 Text Interpretation: Sinus rhythm Atrial premature complexes Borderline T wave abnormalities No significant change since last tracing Confirmed by Kyleen Villatoro ([id number]) on 06/07/2020 11:05:52 AM   Radiology No results found.  Procedures Procedures   Medications Ordered in ED Medications  meclizine (ANTIVERT) tablet 25 mg (25 mg Oral Given 06/07/20 1005)  sodium chloride 0.9 % bolus 1,000 mL (0 mLs Intravenous Stopped 06/07/20 1140)  ondansetron (ZOFRAN) injection 4 mg (4 mg Intravenous Given 06/07/20 1020)    ED Course  I have reviewed the triage vital signs and the nursing notes.  Pertinent labs & imaging results that were available during my care of the patient were reviewed by me and considered in my medical decision making (see chart for details).    MDM Rules/Calculators/A&P                          69  year old female with a history of asthma, sleep apnea, uterine cancer, tinnitus, presents with concern for vertigo.   Differential diagnosis for dizziness includes central causes such as stroke, intracranial bleed, mass/metastatic disease and peripheral causes such as BPPV, meniere's disease, viral.  Vertigo is positional, she has a normal neurologic exam, including normal gait (after meclizine) and coordination, and have low suspicion for CVA or other central cause of vertigo at this time. She also denies any headache.  She does have a history of tinnitus and feel symptoms may represent Meniere's disease.  Labs without signs of anemia or significant electrolyte abnormalities, EKG without significant findings.  She feels significantly improved following meclizine and zofran, is able to ambulate independently.  Given rx for meclizine, recommend PCP follow up and return for new or worsening symptoms. Patient discharged in stable condition with understanding of reasons to return.    Final Clinical Impression(s) / ED Diagnoses  Final diagnoses:  Vertigo     Rx / DC Orders ED Discharge Orders         Ordered    meclizine (ANTIVERT) 25 MG tablet  3 times daily PRN,   Status:  Discontinued        06/07/20 1108    ondansetron (ZOFRAN ODT) 4 MG disintegrating tablet  Every 8 hours PRN,   Status:  Discontinued        06/07/20 1108    meclizine (ANTIVERT) 25 MG tablet  3 times daily PRN        06/07/20 1130    ondansetron (ZOFRAN ODT) 4 MG disintegrating tablet  Every 8 hours PRN        06/07/20 1130           Gareth Morgan, MD 06/08/20 2221

## 2020-06-07 NOTE — ED Triage Notes (Signed)
Dizzy upon awakening at 6am.  Room spinning.  Causing N&V  Emesis x3 NSR w/ PACs on monitor Gait slightly unsteady

## 2020-06-16 DIAGNOSIS — Z1231 Encounter for screening mammogram for malignant neoplasm of breast: Secondary | ICD-10-CM | POA: Diagnosis not present

## 2020-08-13 DIAGNOSIS — H524 Presbyopia: Secondary | ICD-10-CM | POA: Diagnosis not present

## 2020-08-13 DIAGNOSIS — H2513 Age-related nuclear cataract, bilateral: Secondary | ICD-10-CM | POA: Diagnosis not present

## 2020-10-26 DIAGNOSIS — J3081 Allergic rhinitis due to animal (cat) (dog) hair and dander: Secondary | ICD-10-CM | POA: Diagnosis not present

## 2020-10-26 DIAGNOSIS — J019 Acute sinusitis, unspecified: Secondary | ICD-10-CM | POA: Diagnosis not present

## 2020-10-26 DIAGNOSIS — J4530 Mild persistent asthma, uncomplicated: Secondary | ICD-10-CM | POA: Diagnosis not present

## 2020-10-26 DIAGNOSIS — J209 Acute bronchitis, unspecified: Secondary | ICD-10-CM | POA: Diagnosis not present

## 2020-10-27 DIAGNOSIS — D225 Melanocytic nevi of trunk: Secondary | ICD-10-CM | POA: Diagnosis not present

## 2020-10-27 DIAGNOSIS — Z23 Encounter for immunization: Secondary | ICD-10-CM | POA: Diagnosis not present

## 2020-10-27 DIAGNOSIS — C44519 Basal cell carcinoma of skin of other part of trunk: Secondary | ICD-10-CM | POA: Diagnosis not present

## 2020-10-27 DIAGNOSIS — D235 Other benign neoplasm of skin of trunk: Secondary | ICD-10-CM | POA: Diagnosis not present

## 2020-10-27 DIAGNOSIS — L821 Other seborrheic keratosis: Secondary | ICD-10-CM | POA: Diagnosis not present

## 2020-10-27 DIAGNOSIS — C44612 Basal cell carcinoma of skin of right upper limb, including shoulder: Secondary | ICD-10-CM | POA: Diagnosis not present

## 2020-10-27 DIAGNOSIS — L578 Other skin changes due to chronic exposure to nonionizing radiation: Secondary | ICD-10-CM | POA: Diagnosis not present

## 2020-10-27 DIAGNOSIS — D485 Neoplasm of uncertain behavior of skin: Secondary | ICD-10-CM | POA: Diagnosis not present

## 2020-10-27 DIAGNOSIS — L814 Other melanin hyperpigmentation: Secondary | ICD-10-CM | POA: Diagnosis not present

## 2020-10-27 DIAGNOSIS — Z85828 Personal history of other malignant neoplasm of skin: Secondary | ICD-10-CM | POA: Diagnosis not present

## 2020-11-03 DIAGNOSIS — R399 Unspecified symptoms and signs involving the genitourinary system: Secondary | ICD-10-CM | POA: Diagnosis not present

## 2020-11-17 DIAGNOSIS — C44519 Basal cell carcinoma of skin of other part of trunk: Secondary | ICD-10-CM | POA: Diagnosis not present

## 2021-01-18 DIAGNOSIS — K219 Gastro-esophageal reflux disease without esophagitis: Secondary | ICD-10-CM | POA: Diagnosis not present

## 2021-01-18 DIAGNOSIS — J4530 Mild persistent asthma, uncomplicated: Secondary | ICD-10-CM | POA: Diagnosis not present

## 2021-01-18 DIAGNOSIS — R21 Rash and other nonspecific skin eruption: Secondary | ICD-10-CM | POA: Diagnosis not present

## 2021-01-18 DIAGNOSIS — J3081 Allergic rhinitis due to animal (cat) (dog) hair and dander: Secondary | ICD-10-CM | POA: Diagnosis not present

## 2021-02-16 DIAGNOSIS — H2513 Age-related nuclear cataract, bilateral: Secondary | ICD-10-CM | POA: Diagnosis not present

## 2021-03-01 DIAGNOSIS — D239 Other benign neoplasm of skin, unspecified: Secondary | ICD-10-CM | POA: Diagnosis not present

## 2021-04-01 DIAGNOSIS — J4540 Moderate persistent asthma, uncomplicated: Secondary | ICD-10-CM | POA: Diagnosis not present

## 2021-04-01 DIAGNOSIS — J3089 Other allergic rhinitis: Secondary | ICD-10-CM | POA: Diagnosis not present

## 2021-04-01 DIAGNOSIS — E7800 Pure hypercholesterolemia, unspecified: Secondary | ICD-10-CM | POA: Diagnosis not present

## 2021-04-01 DIAGNOSIS — Z0000 Encounter for general adult medical examination without abnormal findings: Secondary | ICD-10-CM | POA: Diagnosis not present

## 2021-04-01 DIAGNOSIS — R030 Elevated blood-pressure reading, without diagnosis of hypertension: Secondary | ICD-10-CM | POA: Diagnosis not present

## 2021-04-01 DIAGNOSIS — Z79899 Other long term (current) drug therapy: Secondary | ICD-10-CM | POA: Diagnosis not present

## 2021-04-01 DIAGNOSIS — H6122 Impacted cerumen, left ear: Secondary | ICD-10-CM | POA: Diagnosis not present

## 2021-04-01 DIAGNOSIS — Z1389 Encounter for screening for other disorder: Secondary | ICD-10-CM | POA: Diagnosis not present

## 2021-04-22 DIAGNOSIS — H6122 Impacted cerumen, left ear: Secondary | ICD-10-CM | POA: Diagnosis not present

## 2021-06-22 DIAGNOSIS — Z1231 Encounter for screening mammogram for malignant neoplasm of breast: Secondary | ICD-10-CM | POA: Diagnosis not present

## 2021-08-19 DIAGNOSIS — H2513 Age-related nuclear cataract, bilateral: Secondary | ICD-10-CM | POA: Diagnosis not present

## 2021-11-10 DIAGNOSIS — D235 Other benign neoplasm of skin of trunk: Secondary | ICD-10-CM | POA: Diagnosis not present

## 2021-11-10 DIAGNOSIS — L814 Other melanin hyperpigmentation: Secondary | ICD-10-CM | POA: Diagnosis not present

## 2021-11-10 DIAGNOSIS — Z85828 Personal history of other malignant neoplasm of skin: Secondary | ICD-10-CM | POA: Diagnosis not present

## 2021-11-10 DIAGNOSIS — L578 Other skin changes due to chronic exposure to nonionizing radiation: Secondary | ICD-10-CM | POA: Diagnosis not present

## 2021-11-10 DIAGNOSIS — L821 Other seborrheic keratosis: Secondary | ICD-10-CM | POA: Diagnosis not present

## 2021-11-10 DIAGNOSIS — D225 Melanocytic nevi of trunk: Secondary | ICD-10-CM | POA: Diagnosis not present

## 2022-01-17 DIAGNOSIS — J4530 Mild persistent asthma, uncomplicated: Secondary | ICD-10-CM | POA: Diagnosis not present

## 2022-01-17 DIAGNOSIS — K219 Gastro-esophageal reflux disease without esophagitis: Secondary | ICD-10-CM | POA: Diagnosis not present

## 2022-01-17 DIAGNOSIS — J3081 Allergic rhinitis due to animal (cat) (dog) hair and dander: Secondary | ICD-10-CM | POA: Diagnosis not present

## 2022-01-17 DIAGNOSIS — R21 Rash and other nonspecific skin eruption: Secondary | ICD-10-CM | POA: Diagnosis not present

## 2022-02-21 DIAGNOSIS — H2513 Age-related nuclear cataract, bilateral: Secondary | ICD-10-CM | POA: Diagnosis not present

## 2022-02-21 DIAGNOSIS — H52203 Unspecified astigmatism, bilateral: Secondary | ICD-10-CM | POA: Diagnosis not present

## 2022-02-21 DIAGNOSIS — H10413 Chronic giant papillary conjunctivitis, bilateral: Secondary | ICD-10-CM | POA: Diagnosis not present

## 2022-03-22 DIAGNOSIS — H2513 Age-related nuclear cataract, bilateral: Secondary | ICD-10-CM | POA: Diagnosis not present

## 2022-04-07 DIAGNOSIS — Z0000 Encounter for general adult medical examination without abnormal findings: Secondary | ICD-10-CM | POA: Diagnosis not present

## 2022-04-07 DIAGNOSIS — J3089 Other allergic rhinitis: Secondary | ICD-10-CM | POA: Diagnosis not present

## 2022-04-07 DIAGNOSIS — J4540 Moderate persistent asthma, uncomplicated: Secondary | ICD-10-CM | POA: Diagnosis not present

## 2022-04-07 DIAGNOSIS — Z23 Encounter for immunization: Secondary | ICD-10-CM | POA: Diagnosis not present

## 2022-04-07 DIAGNOSIS — G4733 Obstructive sleep apnea (adult) (pediatric): Secondary | ICD-10-CM | POA: Diagnosis not present

## 2022-04-07 DIAGNOSIS — K219 Gastro-esophageal reflux disease without esophagitis: Secondary | ICD-10-CM | POA: Diagnosis not present

## 2022-04-07 DIAGNOSIS — E7800 Pure hypercholesterolemia, unspecified: Secondary | ICD-10-CM | POA: Diagnosis not present

## 2022-04-07 DIAGNOSIS — Z13820 Encounter for screening for osteoporosis: Secondary | ICD-10-CM | POA: Diagnosis not present

## 2022-05-03 DIAGNOSIS — L309 Dermatitis, unspecified: Secondary | ICD-10-CM | POA: Diagnosis not present

## 2022-06-24 DIAGNOSIS — M859 Disorder of bone density and structure, unspecified: Secondary | ICD-10-CM | POA: Diagnosis not present

## 2022-06-24 DIAGNOSIS — Z1231 Encounter for screening mammogram for malignant neoplasm of breast: Secondary | ICD-10-CM | POA: Diagnosis not present

## 2022-06-24 DIAGNOSIS — Z13820 Encounter for screening for osteoporosis: Secondary | ICD-10-CM | POA: Diagnosis not present

## 2022-07-27 DIAGNOSIS — R300 Dysuria: Secondary | ICD-10-CM | POA: Diagnosis not present

## 2022-11-28 DIAGNOSIS — L814 Other melanin hyperpigmentation: Secondary | ICD-10-CM | POA: Diagnosis not present

## 2022-11-28 DIAGNOSIS — D225 Melanocytic nevi of trunk: Secondary | ICD-10-CM | POA: Diagnosis not present

## 2022-11-28 DIAGNOSIS — L821 Other seborrheic keratosis: Secondary | ICD-10-CM | POA: Diagnosis not present

## 2022-11-28 DIAGNOSIS — D235 Other benign neoplasm of skin of trunk: Secondary | ICD-10-CM | POA: Diagnosis not present

## 2022-11-28 DIAGNOSIS — Z85828 Personal history of other malignant neoplasm of skin: Secondary | ICD-10-CM | POA: Diagnosis not present

## 2022-11-28 DIAGNOSIS — L578 Other skin changes due to chronic exposure to nonionizing radiation: Secondary | ICD-10-CM | POA: Diagnosis not present

## 2023-01-21 ENCOUNTER — Emergency Department (HOSPITAL_BASED_OUTPATIENT_CLINIC_OR_DEPARTMENT_OTHER)
Admission: EM | Admit: 2023-01-21 | Discharge: 2023-01-21 | Disposition: A | Discharge status: Home / Self Care | Payer: Medicare HMO | Attending: Emergency Medicine | Admitting: Emergency Medicine

## 2023-01-21 ENCOUNTER — Encounter (HOSPITAL_BASED_OUTPATIENT_CLINIC_OR_DEPARTMENT_OTHER): Payer: Self-pay

## 2023-01-21 ENCOUNTER — Other Ambulatory Visit: Payer: Self-pay

## 2023-01-21 DIAGNOSIS — R300 Dysuria: Secondary | ICD-10-CM | POA: Diagnosis present

## 2023-01-21 DIAGNOSIS — N3000 Acute cystitis without hematuria: Secondary | ICD-10-CM | POA: Diagnosis not present

## 2023-01-21 LAB — URINALYSIS, W/ REFLEX TO CULTURE (INFECTION SUSPECTED)
Bilirubin Urine: NEGATIVE
Glucose, UA: NEGATIVE mg/dL
Ketones, ur: NEGATIVE mg/dL
Nitrite: NEGATIVE
Protein, ur: NEGATIVE mg/dL
Specific Gravity, Urine: 1.01 (ref 1.005–1.030)
WBC, UA: >50 WBC/hpf (ref 0–5)
pH: 6 (ref 5.0–8.0)

## 2023-01-21 MED ORDER — PHENAZOPYRIDINE HCL 100 MG PO TABS
95.0000 mg | ORAL_TABLET | Freq: Once | ORAL | Status: AC
  Administered 2023-01-21: 100 mg via ORAL
  Filled 2023-01-21: qty 1

## 2023-01-21 MED ORDER — CEPHALEXIN 250 MG PO CAPS
500.0000 mg | ORAL_CAPSULE | Freq: Once | ORAL | Status: AC
  Administered 2023-01-21: 500 mg via ORAL
  Filled 2023-01-21: qty 2

## 2023-01-21 MED ORDER — CEPHALEXIN 500 MG PO CAPS: 500.0000 mg | ORAL_CAPSULE | Freq: Two times a day (BID) | ORAL | 0 refills | Status: AC

## 2023-01-21 MED ORDER — PHENAZOPYRIDINE HCL 200 MG PO TABS: 200.0000 mg | ORAL_TABLET | Freq: Three times a day (TID) | ORAL | 0 refills | Status: AC

## 2023-01-21 NOTE — Discharge Instructions (Addendum)
 Please read and follow all provided instructions.  Your diagnoses today include:  1. Acute cystitis without hematuria     Tests performed today include: Urine test - suggests that you have an infection in your bladder Vital signs. See below for your results today.   Medications prescribed:  Keflex  (cephalexin ) - antibiotic  You have been prescribed an antibiotic medicine: take the entire course of medicine even if you are feeling better. Stopping early can cause the antibiotic not to work.  Pyridium  - medication for urinary tract infection symptoms.   This medication will turn your urine orange. This is normal.   Home care instructions:  Follow any educational materials contained in this packet.  Follow-up instructions: Please follow-up with your primary care provider in 3 days if symptoms are not resolved for further evaluation of your symptoms.  Return instructions:  Please return to the Emergency Department if you experience worsening symptoms.  Return with fever, worsening pain, persistent vomiting, worsening pain in your back.  Please return if you have any other emergent concerns.  Additional Information:  Your vital signs today were: BP (!) 159/80   Pulse 69   Temp 97.8 F (36.6 C) (Oral)   Resp 16   Ht 5' 5 (1.651 m)   Wt 64.9 kg   SpO2 99%   BMI 23.80 kg/m  If your blood pressure (BP) was elevated above 135/85 this visit, please have this repeated by your doctor within one month. --------------

## 2023-01-21 NOTE — ED Provider Notes (Signed)
 [facility] EMERGENCY DEPARTMENT AT [facility] Provider Note   CSN: [id number] Arrival date & time: 01/21/23  1148     History  Chief Complaint  Patient presents with   Dysuria    Diana Hampton is a 73 y.o. female.  Patient with history of previous UTI presents emergency department today for dysuria, increased frequency and urgency starting this morning.  She denies associated fevers, nausea, vomiting.  She has had suprapubic pain without other pain in other areas.  Denies diarrhea or blood in the stool.  No treatments prior to arrival.       Home Medications Prior to Admission medications   Medication Sig Start Date End Date Taking? Authorizing Provider  albuterol  (PROVENTIL  HFA;VENTOLIN  HFA) 108 (90 Base) MCG/ACT inhaler Inhale 2 puffs into the lungs every 6 (six) hours as needed. 04/27/17   Christopher Savannah, PA-C  atorvastatin  (LIPITOR) 20 MG tablet TAKE 1 BY MOUTH DAILY -GREGORY CALONE,PA 05/21/15   Calone, Gregory D, FNP  azelastine  (ASTELIN ) 0.1 % nasal spray Place 1 spray into both nostrils 2 (two) times daily. Use in each nostril as directed Patient taking differently: Place 1 spray into both nostrils as needed. Use in each nostril as directed 01/06/18   Arlyss Rumpf, NP  benzonatate  (TESSALON ) 100 MG capsule Take 1 capsule (100 mg total) by mouth 2 (two) times daily as needed for cough. Patient not taking: No sig reported 01/06/18   Arlyss Rumpf, NP  dextromethorphan-guaiFENesin  (MUCINEX  DM) 30-600 MG 12hr tablet Take 1 tablet by mouth 2 (two) times daily.    [provider]  esomeprazole (NEXIUM) 20 MG capsule Take 20 mg by mouth daily at 12 noon.    [provider]  fluticasone furoate-vilanterol (BREO ELLIPTA) 100-25 MCG/INH AEPB Inhale 1 puff into the lungs daily.    [provider]  Fluticasone-Salmeterol (ADVAIR DISKUS IN) Inhale into the lungs 2 (two) times daily.    [provider]  HYDROcodone -homatropine (HYCODAN)  5-1.5 MG/5ML syrup Take 5 mLs by mouth at bedtime as needed. Patient not taking: No sig reported 04/27/17   Christopher Savannah, PA-C  meclizine  (ANTIVERT ) 25 MG tablet Take 1 tablet (25 mg total) by mouth 3 (three) times daily as needed for dizziness. 06/07/20   Dreama Longs, MD  montelukast  (SINGULAIR ) 10 MG tablet at bedtime.     [provider]  ondansetron  (ZOFRAN  ODT) 4 MG disintegrating tablet Take 1 tablet (4 mg total) by mouth every 8 (eight) hours as needed for nausea or vomiting. 06/07/20   Dreama Longs, MD  predniSONE  (DELTASONE ) 20 MG tablet Take 2 tablets daily with breakfast. Patient not taking: No sig reported 04/27/17   Christopher Savannah, PA-C      Allergies    Patient has no known allergies.    Review of Systems   Review of Systems  Physical Exam Updated Vital Signs BP (!) 159/80   Pulse 69   Temp 97.8 F (36.6 C) (Oral)   Resp 16   Ht 5' 5 (1.651 m)   Wt 64.9 kg   SpO2 99%   BMI 23.80 kg/m  Physical Exam Vitals and nursing note reviewed.  Constitutional:      Appearance: She is well-developed.  HENT:     Head: Normocephalic and atraumatic.  Eyes:     Conjunctiva/sclera: Conjunctivae normal.  Pulmonary:     Effort: No respiratory distress.  Abdominal:     Tenderness: There is abdominal tenderness. There is no guarding or rebound.  Comments: Suprapubic tenderness  Musculoskeletal:     Cervical back: Normal range of motion and neck supple.  Skin:    General: Skin is warm and dry.  Neurological:     Mental Status: She is alert.     ED Results / Procedures / Treatments   Labs (all labs ordered are listed, but only abnormal results are displayed) Labs Reviewed  URINALYSIS, W/ REFLEX TO CULTURE (INFECTION SUSPECTED) - Abnormal; Notable for the following components:      Result Value   APPearance CLOUDY (*)    Hgb urine dipstick LARGE (*)    Leukocytes,Ua MODERATE (*)    Bacteria, UA FEW (*)    All other components within normal limits  URINE  CULTURE  URINALYSIS, ROUTINE W REFLEX MICROSCOPIC    EKG None  Radiology No results found.  Procedures Procedures    Medications Ordered in ED Medications  cephALEXin  (KEFLEX ) capsule 500 mg (has no administration in time range)  phenazopyridine  (PYRIDIUM ) tablet 100 mg (has no administration in time range)    ED Course/ Medical Decision Making/ A&P    Patient seen and examined. History obtained directly from patient. Work-up including labs, imaging, EKG ordered in triage, if performed, were reviewed.    Labs/EKG: Independently reviewed and interpreted.  This included: UA, suggestive of UTI.  Imaging: None ordered  Medications/Fluids: Ordered: Keflex , Pyridium   Most recent vital signs reviewed and are as follows: BP (!) 159/80   Pulse 69   Temp 97.8 F (36.6 C) (Oral)   Resp 16   Ht 5' 5 (1.651 m)   Wt 64.9 kg   SpO2 99%   BMI 23.80 kg/m   Initial impression: Acute cystitis, uncomplicated  Home treatment plan: Hydration  Return instructions discussed with patient: Fevers, worsening pain  Follow-up instructions discussed with patient: PCP if symptoms not improving                                Medical Decision Making Amount and/or Complexity of Data Reviewed Labs: ordered.  Risk Prescription drug management.   Patient with irritative UTI symptoms consistent with uncomplicated cystitis.  No other abdominal pain or abdominal symptoms to suggest diverticulitis or other intra-abdominal infection.  No signs of pyelonephritis.  Patient appears well, nontoxic.         Final Clinical Impression(s) / ED Diagnoses Final diagnoses:  Acute cystitis without hematuria    Rx / DC Orders ED Discharge Orders          Ordered    cephALEXin  (KEFLEX ) 500 MG capsule  2 times daily        01/21/23 1238    phenazopyridine  (PYRIDIUM ) 200 MG tablet  3 times daily        01/21/23 1238              Desiderio Chew, PA-C 01/21/23 1241    Elnor Savant  A, DO 01/21/23 1302

## 2023-01-21 NOTE — ED Triage Notes (Signed)
 Today the patient has burning with urinating.

## 2023-01-23 LAB — URINE CULTURE: Culture: >=100000 — AB

## 2023-01-25 DIAGNOSIS — J3081 Allergic rhinitis due to animal (cat) (dog) hair and dander: Secondary | ICD-10-CM | POA: Diagnosis not present

## 2023-01-25 DIAGNOSIS — R21 Rash and other nonspecific skin eruption: Secondary | ICD-10-CM | POA: Diagnosis not present

## 2023-01-25 DIAGNOSIS — K219 Gastro-esophageal reflux disease without esophagitis: Secondary | ICD-10-CM | POA: Diagnosis not present

## 2023-01-25 DIAGNOSIS — J4530 Mild persistent asthma, uncomplicated: Secondary | ICD-10-CM | POA: Diagnosis not present

## 2023-03-23 DIAGNOSIS — H10413 Chronic giant papillary conjunctivitis, bilateral: Secondary | ICD-10-CM | POA: Diagnosis not present

## 2023-03-23 DIAGNOSIS — H5201 Hypermetropia, right eye: Secondary | ICD-10-CM | POA: Diagnosis not present

## 2023-03-23 DIAGNOSIS — H00012 Hordeolum externum right lower eyelid: Secondary | ICD-10-CM | POA: Diagnosis not present

## 2023-03-23 DIAGNOSIS — H2513 Age-related nuclear cataract, bilateral: Secondary | ICD-10-CM | POA: Diagnosis not present

## 2023-04-14 DIAGNOSIS — N390 Urinary tract infection, site not specified: Secondary | ICD-10-CM | POA: Diagnosis not present

## 2023-04-14 DIAGNOSIS — E7800 Pure hypercholesterolemia, unspecified: Secondary | ICD-10-CM | POA: Diagnosis not present

## 2023-04-14 DIAGNOSIS — J4540 Moderate persistent asthma, uncomplicated: Secondary | ICD-10-CM | POA: Diagnosis not present

## 2023-05-30 DIAGNOSIS — R300 Dysuria: Secondary | ICD-10-CM | POA: Diagnosis not present

## 2023-06-17 ENCOUNTER — Other Ambulatory Visit: Payer: Self-pay

## 2023-06-17 ENCOUNTER — Emergency Department (HOSPITAL_BASED_OUTPATIENT_CLINIC_OR_DEPARTMENT_OTHER): Admission: EM | Admit: 2023-06-17 | Discharge: 2023-06-17 | Disposition: A | Discharge status: Home / Self Care

## 2023-06-17 ENCOUNTER — Encounter (HOSPITAL_BASED_OUTPATIENT_CLINIC_OR_DEPARTMENT_OTHER): Payer: Self-pay

## 2023-06-17 DIAGNOSIS — N3001 Acute cystitis with hematuria: Secondary | ICD-10-CM | POA: Insufficient documentation

## 2023-06-17 DIAGNOSIS — R319 Hematuria, unspecified: Secondary | ICD-10-CM | POA: Diagnosis not present

## 2023-06-17 LAB — CBC
HCT: 39.1 % (ref 36.0–46.0)
Hemoglobin: 12.9 g/dL (ref 12.0–15.0)
MCH: 29.6 pg (ref 26.0–34.0)
MCHC: 33 g/dL (ref 30.0–36.0)
MCV: 89.7 fL (ref 80.0–100.0)
Platelets: 327 10*3/uL (ref 150–400)
RBC: 4.36 MIL/uL (ref 3.87–5.11)
RDW: 13.5 % (ref 11.5–15.5)
WBC: 7.1 10*3/uL (ref 4.0–10.5)
nRBC: 0 % (ref 0.0–0.2)

## 2023-06-17 LAB — BASIC METABOLIC PANEL WITH GFR
Anion gap: 11 (ref 5–15)
BUN: 19 mg/dL (ref 8–23)
CO2: 25 mmol/L (ref 22–32)
Calcium: 9.4 mg/dL (ref 8.9–10.3)
Chloride: 104 mmol/L (ref 98–111)
Creatinine, Ser: 1.05 mg/dL — ABNORMAL HIGH (ref 0.44–1.00)
GFR, Estimated: 56 mL/min — ABNORMAL LOW (ref >60)
Glucose, Bld: 125 mg/dL — ABNORMAL HIGH (ref 70–99)
Potassium: 4.1 mmol/L (ref 3.5–5.1)
Sodium: 140 mmol/L (ref 135–145)

## 2023-06-17 LAB — URINALYSIS, MICROSCOPIC (REFLEX): RBC / HPF: >50 RBC/hpf (ref 0–5)

## 2023-06-17 LAB — URINALYSIS, ROUTINE W REFLEX MICROSCOPIC
Leukocytes,Ua: NEGATIVE
Nitrite: NEGATIVE

## 2023-06-17 MED ORDER — SULFAMETHOXAZOLE-TRIMETHOPRIM 800-160 MG PO TABS: 1.0000 | ORAL_TABLET | Freq: Two times a day (BID) | ORAL | 0 refills | Status: AC

## 2023-06-17 MED ORDER — SODIUM CHLORIDE 0.9 % IV SOLN
1.0000 g | Freq: Once | INTRAVENOUS | Status: AC
  Administered 2023-06-17: 1 g via INTRAVENOUS
  Filled 2023-06-17: qty 10

## 2023-06-17 NOTE — ED Triage Notes (Signed)
 Pt reports getting up this AM being unable to urinate. Pt reports when she finally was able to urinate she had a burning sensation and noticed blood when wiping. Pt reports recent hx of UTI's.

## 2023-06-17 NOTE — Discharge Instructions (Signed)
 We are prescribing antibiotics.  Please follow-up with your primary doctor.  Return if you develop fevers, chills, severe abdominal pain, inability to urinate, inability to take your antibiotics due to nausea vomiting or you develop any new or worsening symptoms that are concerning to you.

## 2023-06-17 NOTE — ED Provider Notes (Signed)
  EMERGENCY DEPARTMENT AT [facility] Provider Note   CSN: [id number] Arrival date & time: 06/17/23  1050     History  Chief Complaint  Patient presents with   Dysuria    Diana Hampton is a 73 y.o. female.  73 year old female presenting emergency department with hematuria.  Recent urinary tract infection just off antibiotics this past week.  Reports frequency and dysuria this morning.  Noted some blood in urine.  No lightheadedness, dizziness, chest pain, shortness of breath, abdominal pain.  She is voiding.  Not on a blood thinner.   Dysuria      Home Medications Prior to Admission medications   Medication Sig Start Date End Date Taking? Authorizing Provider  sulfamethoxazole-trimethoprim (BACTRIM DS) 800-160 MG tablet Take 1 tablet by mouth 2 (two) times daily for 7 days. 06/17/23 06/24/23 Yes Macenzie Burford, Lajean Pike, DO  albuterol  (PROVENTIL  HFA;VENTOLIN  HFA) 108 (90 Base) MCG/ACT inhaler Inhale 2 puffs into the lungs every 6 (six) hours as needed. 04/27/17   Adolph Hoop, PA-C  atorvastatin  (LIPITOR) 20 MG tablet TAKE 1 BY MOUTH DAILY -GREGORY CALONE,PA 05/21/15   Calone, Gregory D, FNP  azelastine  (ASTELIN ) 0.1 % nasal spray Place 1 spray into both nostrils 2 (two) times daily. Use in each nostril as directed Patient taking differently: Place 1 spray into both nostrils as needed. Use in each nostril as directed 01/06/18   Graham, Elysa, NP  dextromethorphan-guaiFENesin  (MUCINEX  DM) 30-600 MG 12hr tablet Take 1 tablet by mouth 2 (two) times daily.    [provider]  esomeprazole (NEXIUM) 20 MG capsule Take 20 mg by mouth daily at 12 noon.    [provider]  fluticasone furoate-vilanterol (BREO ELLIPTA) 100-25 MCG/INH AEPB Inhale 1 puff into the lungs daily.    [provider]  Fluticasone-Salmeterol (ADVAIR DISKUS IN) Inhale into the lungs 2 (two) times daily.    [provider]  meclizine  (ANTIVERT ) 25 MG tablet Take 1 tablet  (25 mg total) by mouth 3 (three) times daily as needed for dizziness. 06/07/20   Scarlette Currier, MD  montelukast  (SINGULAIR ) 10 MG tablet at bedtime.     [provider]  ondansetron  (ZOFRAN  ODT) 4 MG disintegrating tablet Take 1 tablet (4 mg total) by mouth every 8 (eight) hours as needed for nausea or vomiting. 06/07/20   Scarlette Currier, MD  phenazopyridine  (PYRIDIUM ) 200 MG tablet Take 1 tablet (200 mg total) by mouth 3 (three) times daily. 01/21/23   Lyna Sandhoff, PA-C      Allergies    Patient has no known allergies.    Review of Systems   Review of Systems  Genitourinary:  Positive for dysuria.    Physical Exam Updated Vital Signs BP (!) 121/57   Pulse 69   Temp (!) 97.2 F (36.2 C)   Resp 20   Ht 5\' 5"  (1.651 m)   Wt 64.9 kg   SpO2 98%   BMI 23.80 kg/m  Physical Exam Vitals and nursing note reviewed.  Constitutional:      General: She is not in acute distress.    Appearance: She is not toxic-appearing.  HENT:     Head: Normocephalic.     Nose: Nose normal.     Mouth/Throat:     Mouth: Mucous membranes are moist.  Eyes:     Conjunctiva/sclera: Conjunctivae normal.  Cardiovascular:     Rate and Rhythm: Normal rate.  Pulmonary:     Effort: Pulmonary effort is normal.  Abdominal:  General: Abdomen is flat. There is no distension.     Tenderness: There is no abdominal tenderness. There is no guarding or rebound.  Musculoskeletal:        General: Normal range of motion.  Skin:    General: Skin is warm.     Capillary Refill: Capillary refill takes less than 2 seconds.  Neurological:     Mental Status: She is alert and oriented to person, place, and time.  Psychiatric:        Mood and Affect: Mood normal.        Behavior: Behavior normal.     ED Results / Procedures / Treatments   Labs (all labs ordered are listed, but only abnormal results are displayed) Labs Reviewed  URINALYSIS, ROUTINE W REFLEX MICROSCOPIC - Abnormal; Notable for the  following components:      Result Value   Color, Urine BROWN (*)    APPearance TURBID (*)    Glucose, UA   (*)    Value: TEST NOT REPORTED DUE TO COLOR INTERFERENCE OF URINE PIGMENT   Hgb urine dipstick   (*)    Value: TEST NOT REPORTED DUE TO COLOR INTERFERENCE OF URINE PIGMENT   Bilirubin Urine   (*)    Value: TEST NOT REPORTED DUE TO COLOR INTERFERENCE OF URINE PIGMENT   Ketones, ur   (*)    Value: TEST NOT REPORTED DUE TO COLOR INTERFERENCE OF URINE PIGMENT   Protein, ur   (*)    Value: TEST NOT REPORTED DUE TO COLOR INTERFERENCE OF URINE PIGMENT   All other components within normal limits  BASIC METABOLIC PANEL WITH GFR - Abnormal; Notable for the following components:   Glucose, Bld 125 (*)    Creatinine, Ser 1.05 (*)    GFR, Estimated 56 (*)    All other components within normal limits  URINALYSIS, MICROSCOPIC (REFLEX) - Abnormal; Notable for the following components:   Bacteria, UA MANY (*)    All other components within normal limits  URINE CULTURE  CBC    EKG None  Radiology No results found.  Procedures Procedures    Medications Ordered in ED Medications  cefTRIAXone (ROCEPHIN) 1 g in sodium chloride  0.9 % 100 mL IVPB (0 g Intravenous Stopped 06/17/23 1258)    ED Course/ Medical Decision Making/ A&P                                 Medical Decision Making This is a 73 year old female presenting emergency department for hematuria has history of recurrent urinary tract infections.  Has appointment with urology in September.  Well-appearing on exam, benign abdominal exam.  Labs with bacteria.  No metabolic derangements.  Minor elevation in creatinine, but has been several years prior.  Not consistent with true AKI.  She has no anemia on her CBC, no leukocytosis.  Shared decision making regarding CT scan versus trial of antibiotics given her history of recurrent UTIs and her UTI symptoms that she has today.  Patient elected for antibiotics and to follow-up with her  primary doctor.  Discussed need to follow-up with urology.  Patient stable for discharge at this time.  Per chart review it does appear that she had E. coli on her last urine culture that was sensitive to Bactrim.    Amount and/or Complexity of Data Reviewed Independent Historian:     Details: Husband notes she has had seemingly 4 episodes recently of  acute UTI External Data Reviewed:     Details: Not taking a blood thinner Labs: ordered. Radiology:     Details: See above  Risk Prescription drug management.         Final Clinical Impression(s) / ED Diagnoses Final diagnoses:  Acute cystitis with hematuria    Rx / DC Orders ED Discharge Orders          Ordered    sulfamethoxazole-trimethoprim (BACTRIM DS) 800-160 MG tablet  2 times daily        06/17/23 1333              Rolinda Climes, DO 06/17/23 1336

## 2023-06-19 LAB — URINE CULTURE: Culture: 20000 — AB

## 2023-06-20 ENCOUNTER — Telehealth (HOSPITAL_BASED_OUTPATIENT_CLINIC_OR_DEPARTMENT_OTHER): Payer: Self-pay | Admitting: *Deleted

## 2023-06-20 DIAGNOSIS — N390 Urinary tract infection, site not specified: Secondary | ICD-10-CM | POA: Diagnosis not present

## 2023-06-20 NOTE — Telephone Encounter (Signed)
 Post ED Visit - Positive Culture Follow-up  Culture report reviewed by antimicrobial stewardship pharmacist: [facility] [x]  Barbra Boone, [place].D. []  Skeet Duke, Pharm.D., BCPS AQ-ID []  Leslee Rase, Pharm.D., BCPS []  Garland Junk, Pharm.D., BCPS []  [place], [address].D., BCPS, AAHIVP []  Alcide Aly, Pharm.D., BCPS, AAHIVP []  Jerri Morale, PharmD, BCPS []  Graham Laws, PharmD, BCPS []  Cleda Curly, PharmD, BCPS []  Tamar Fairly, PharmD []  Ballard Levels, PharmD, BCPS []  Ollen Beverage, PharmD  [facility] []  Arlyne Bering, PharmD []  Sherryle Don, PharmD []  Van Gelinas, PharmD []  Delila Felty, Rph []  [place]) Cleora Daft, PharmD []  Augustina Block, PharmD []  Arie Kurtz, PharmD []  Sharlyn Deaner, PharmD []  Agnes Hose, PharmD []  Kendall Pauls, PharmD []  Gladstone Lamer, PharmD []  Armanda Bern, PharmD []  Tera Fellows, PharmD   Positive urine culture Treated with Sulfamethoxazole-Trimethoprim, organism sensitive to the same and no further patient follow-up is required at this time.  Lovell Rubenstein Sharion Davidson 06/20/2023, 5:14 PM

## 2023-06-30 DIAGNOSIS — Z1231 Encounter for screening mammogram for malignant neoplasm of breast: Secondary | ICD-10-CM | POA: Diagnosis not present

## 2023-07-21 DIAGNOSIS — N1330 Unspecified hydronephrosis: Secondary | ICD-10-CM | POA: Diagnosis not present

## 2023-07-21 DIAGNOSIS — N390 Urinary tract infection, site not specified: Secondary | ICD-10-CM | POA: Diagnosis not present

## 2023-09-29 DIAGNOSIS — N952 Postmenopausal atrophic vaginitis: Secondary | ICD-10-CM | POA: Diagnosis not present

## 2023-09-29 DIAGNOSIS — N390 Urinary tract infection, site not specified: Secondary | ICD-10-CM | POA: Diagnosis not present

## 2023-11-29 DIAGNOSIS — L280 Lichen simplex chronicus: Secondary | ICD-10-CM | POA: Diagnosis not present

## 2023-12-06 DIAGNOSIS — N949 Unspecified condition associated with female genital organs and menstrual cycle: Secondary | ICD-10-CM | POA: Diagnosis not present

## 2023-12-06 DIAGNOSIS — N390 Urinary tract infection, site not specified: Secondary | ICD-10-CM | POA: Diagnosis not present

## 2023-12-29 DIAGNOSIS — N390 Urinary tract infection, site not specified: Secondary | ICD-10-CM | POA: Diagnosis not present
